# Patient Record
Sex: Female | Born: 1970 | Race: White | Hispanic: No | Smoking: Current every day smoker
Health system: Southern US, Community
[De-identification: ages and names within clinical notes are randomized; demographics above are authoritative.]

## PROBLEM LIST (undated history)

## (undated) DIAGNOSIS — L03112 Cellulitis of left axilla: Secondary | ICD-10-CM

## (undated) DIAGNOSIS — F32A Depression, unspecified: Secondary | ICD-10-CM

## (undated) DIAGNOSIS — M1990 Unspecified osteoarthritis, unspecified site: Secondary | ICD-10-CM

## (undated) DIAGNOSIS — C539 Malignant neoplasm of cervix uteri, unspecified: Secondary | ICD-10-CM

## (undated) DIAGNOSIS — Z87442 Personal history of urinary calculi: Secondary | ICD-10-CM

## (undated) DIAGNOSIS — F329 Major depressive disorder, single episode, unspecified: Secondary | ICD-10-CM

## (undated) DIAGNOSIS — J45909 Unspecified asthma, uncomplicated: Secondary | ICD-10-CM

## (undated) DIAGNOSIS — H6690 Otitis media, unspecified, unspecified ear: Secondary | ICD-10-CM

## (undated) DIAGNOSIS — I10 Essential (primary) hypertension: Secondary | ICD-10-CM

## (undated) DIAGNOSIS — N200 Calculus of kidney: Secondary | ICD-10-CM

## (undated) DIAGNOSIS — S322XXA Fracture of coccyx, initial encounter for closed fracture: Secondary | ICD-10-CM

## (undated) DIAGNOSIS — F419 Anxiety disorder, unspecified: Secondary | ICD-10-CM

## (undated) DIAGNOSIS — K219 Gastro-esophageal reflux disease without esophagitis: Secondary | ICD-10-CM

## (undated) DIAGNOSIS — E781 Pure hyperglyceridemia: Secondary | ICD-10-CM

## (undated) HISTORY — PX: DILATION AND EVACUATION: SHX1459

## (undated) HISTORY — PX: TONSILLECTOMY: SUR1361

## (undated) HISTORY — PX: CERVICAL CONE BIOPSY: SUR198

---

## 1989-06-10 HISTORY — PX: DILATION AND CURETTAGE OF UTERUS: SHX78

## 1997-11-14 ENCOUNTER — Other Ambulatory Visit: Admission: RE | Admit: 1997-11-14 | Discharge: 1997-11-14 | Payer: Self-pay | Admitting: Obstetrics

## 1997-12-21 ENCOUNTER — Other Ambulatory Visit: Admission: RE | Admit: 1997-12-21 | Discharge: 1997-12-21 | Payer: Self-pay | Admitting: Obstetrics

## 1998-01-19 ENCOUNTER — Ambulatory Visit (HOSPITAL_COMMUNITY): Admission: RE | Admit: 1998-01-19 | Discharge: 1998-01-19 | Payer: Self-pay | Admitting: Obstetrics

## 1998-07-25 ENCOUNTER — Other Ambulatory Visit: Admission: RE | Admit: 1998-07-25 | Discharge: 1998-07-25 | Payer: Self-pay | Admitting: Obstetrics

## 1999-07-25 ENCOUNTER — Other Ambulatory Visit: Admission: RE | Admit: 1999-07-25 | Discharge: 1999-07-25 | Payer: Self-pay | Admitting: Obstetrics

## 2000-08-20 ENCOUNTER — Other Ambulatory Visit: Admission: RE | Admit: 2000-08-20 | Discharge: 2000-08-20 | Payer: Self-pay | Admitting: Obstetrics

## 2003-07-31 ENCOUNTER — Emergency Department (HOSPITAL_COMMUNITY): Admission: EM | Admit: 2003-07-31 | Discharge: 2003-07-31 | Payer: Self-pay | Admitting: Emergency Medicine

## 2004-09-27 ENCOUNTER — Observation Stay (HOSPITAL_COMMUNITY): Admission: AD | Admit: 2004-09-27 | Discharge: 2004-09-28 | Payer: Self-pay | Admitting: Obstetrics

## 2004-09-28 ENCOUNTER — Encounter (INDEPENDENT_AMBULATORY_CARE_PROVIDER_SITE_OTHER): Payer: Self-pay | Admitting: Specialist

## 2005-01-04 ENCOUNTER — Emergency Department (HOSPITAL_COMMUNITY): Admission: EM | Admit: 2005-01-04 | Discharge: 2005-01-04 | Payer: Self-pay | Admitting: Emergency Medicine

## 2006-05-18 ENCOUNTER — Emergency Department (HOSPITAL_COMMUNITY): Admission: AD | Admit: 2006-05-18 | Discharge: 2006-05-18 | Payer: Self-pay | Admitting: Family Medicine

## 2007-04-06 ENCOUNTER — Ambulatory Visit (HOSPITAL_COMMUNITY): Admission: RE | Admit: 2007-04-06 | Discharge: 2007-04-06 | Payer: Self-pay | Admitting: Sports Medicine

## 2008-02-09 ENCOUNTER — Emergency Department (HOSPITAL_COMMUNITY): Admission: EM | Admit: 2008-02-09 | Discharge: 2008-02-09 | Payer: Self-pay | Admitting: Emergency Medicine

## 2008-04-19 ENCOUNTER — Emergency Department (HOSPITAL_COMMUNITY): Admission: EM | Admit: 2008-04-19 | Discharge: 2008-04-19 | Payer: Self-pay | Admitting: Emergency Medicine

## 2010-10-26 NOTE — Op Note (Signed)
[name], [name]               ACCOUNT NO.:  [id number]   MEDICAL RECORD NO.:  [id number]          PATIENT TYPE:  OBV   LOCATION:  9308                          FACILITY:  [facility]   PHYSICIAN:  Kathreen Cosier, M.D.DATE OF BIRTH:  July 07, 1970   DATE OF PROCEDURE:  09/28/2004  DATE OF DISCHARGE:                                 OPERATIVE REPORT   PREOPERATIVE DIAGNOSIS:  Intrauterine fetal demise at eight weeks and  incomplete abortion.   Using MAC, patient in lithotomy position, the perineum and vagina were  prepped and draped, bladder emptied with a straight catheter.  Bimanual exam  revealed the uterus to be retroverted.  A weighted speculum placed in the  vagina.  It was noted that products of conception were in the cervix.  The  cervix was injected with 1% Xylocaine, a total of 9 mL, at 9, 3 and 12  o'clock.  Using the sponge forceps, the products of conception were removed  from the cervix and the uterine cavity with a #9 suction used to aspirate  the remnants of the products of conception until the cavity was clean.  The  patient tolerated the procedure well, taken to the recovery room in good  condition.      BAM/MEDQ  D:  09/28/2004  T:  09/28/2004  Job:  [id number]

## 2011-01-09 ENCOUNTER — Other Ambulatory Visit: Payer: Self-pay | Admitting: Obstetrics

## 2011-01-30 ENCOUNTER — Other Ambulatory Visit: Payer: Self-pay | Admitting: Family Medicine

## 2011-01-30 ENCOUNTER — Ambulatory Visit
Admission: RE | Admit: 2011-01-30 | Discharge: 2011-01-30 | Disposition: A | Discharge status: Home / Self Care | Payer: No Typology Code available for payment source | Source: Ambulatory Visit | Attending: Family Medicine | Admitting: Family Medicine

## 2011-01-30 DIAGNOSIS — R599 Enlarged lymph nodes, unspecified: Secondary | ICD-10-CM

## 2011-01-30 MED ORDER — IOHEXOL 300 MG/ML  SOLN
75.0000 mL | Freq: Once | INTRAMUSCULAR | Status: AC | PRN
  Administered 2011-01-30: 75 mL via INTRAVENOUS

## 2011-02-03 ENCOUNTER — Ambulatory Visit (INDEPENDENT_AMBULATORY_CARE_PROVIDER_SITE_OTHER): Payer: Self-pay

## 2011-02-03 ENCOUNTER — Inpatient Hospital Stay (INDEPENDENT_AMBULATORY_CARE_PROVIDER_SITE_OTHER)
Admission: RE | Admit: 2011-02-03 | Discharge: 2011-02-03 | Disposition: A | Discharge status: Home / Self Care | Payer: Self-pay | Source: Ambulatory Visit | Attending: Family Medicine | Admitting: Family Medicine

## 2011-02-03 DIAGNOSIS — R599 Enlarged lymph nodes, unspecified: Secondary | ICD-10-CM

## 2011-03-12 LAB — POCT PREGNANCY, URINE: Preg Test, Ur: NEGATIVE

## 2013-09-19 ENCOUNTER — Encounter (HOSPITAL_COMMUNITY): Payer: Self-pay | Admitting: Emergency Medicine

## 2013-09-19 ENCOUNTER — Emergency Department (HOSPITAL_COMMUNITY)
Admission: EM | Admit: 2013-09-19 | Discharge: 2013-09-19 | Disposition: A | Discharge status: Home / Self Care | Payer: Self-pay | Attending: Emergency Medicine | Admitting: Emergency Medicine

## 2013-09-19 DIAGNOSIS — Z8781 Personal history of (healed) traumatic fracture: Secondary | ICD-10-CM | POA: Insufficient documentation

## 2013-09-19 DIAGNOSIS — T7840XA Allergy, unspecified, initial encounter: Secondary | ICD-10-CM

## 2013-09-19 DIAGNOSIS — R0602 Shortness of breath: Secondary | ICD-10-CM | POA: Insufficient documentation

## 2013-09-19 DIAGNOSIS — L509 Urticaria, unspecified: Secondary | ICD-10-CM

## 2013-09-19 DIAGNOSIS — I10 Essential (primary) hypertension: Secondary | ICD-10-CM | POA: Insufficient documentation

## 2013-09-19 DIAGNOSIS — L500 Allergic urticaria: Secondary | ICD-10-CM | POA: Insufficient documentation

## 2013-09-19 DIAGNOSIS — Z8541 Personal history of malignant neoplasm of cervix uteri: Secondary | ICD-10-CM | POA: Insufficient documentation

## 2013-09-19 HISTORY — DX: Malignant neoplasm of cervix uteri, unspecified: C53.9

## 2013-09-19 MED ORDER — DIPHENHYDRAMINE HCL 50 MG/ML IJ SOLN
50.0000 mg | Freq: Once | INTRAMUSCULAR | Status: AC
  Administered 2013-09-19: 50 mg via INTRAVENOUS
  Filled 2013-09-19: qty 1

## 2013-09-19 MED ORDER — SODIUM CHLORIDE 0.9 % IV BOLUS (SEPSIS)
1000.0000 mL | Freq: Once | INTRAVENOUS | Status: AC
  Administered 2013-09-19: 1000 mL via INTRAVENOUS

## 2013-09-19 MED ORDER — PREDNISONE 10 MG PO TABS: ORAL_TABLET | ORAL | Status: DC

## 2013-09-19 MED ORDER — METHYLPREDNISOLONE SODIUM SUCC 125 MG IJ SOLR
125.0000 mg | Freq: Once | INTRAMUSCULAR | Status: AC
  Administered 2013-09-19: 125 mg via INTRAVENOUS
  Filled 2013-09-19: qty 2

## 2013-09-19 MED ORDER — FAMOTIDINE IN NACL 20-0.9 MG/50ML-% IV SOLN
20.0000 mg | Freq: Once | INTRAVENOUS | Status: AC
  Administered 2013-09-19: 20 mg via INTRAVENOUS
  Filled 2013-09-19: qty 50

## 2013-09-19 NOTE — ED Provider Notes (Signed)
CSN: [id number]     Arrival date & time 09/19/13  1903 History   First MD Initiated Contact with Patient 09/19/13 1912     Chief Complaint  Patient presents with  . Allergic Reaction     (Consider location/radiation/quality/duration/timing/severity/associated sxs/prior Treatment) The history is provided by the patient.  Whitney Murphy is a 43 y.o. female here with rash, itchiness, tingling on lower lips. Patient has intermittent rash that is worse at night over the last month. No new food or shampoos or cleaning supplies. Today about an hour prior to arrival, she noticed her lower lips are tingling and she had some subjective shortness of breath. Denies throat closing. Denies recent camping trips. She is not on any blood pressure medicines.    Past Medical History  Diagnosis Date  . Cervical cancer   . Ankle fracture    History reviewed. No pertinent past surgical history. No family history on file. History  Substance Use Topics  . Smoking status: Not on file  . Smokeless tobacco: Not on file  . Alcohol Use: Not on file   OB History   Grav Para Term Preterm Abortions TAB SAB Ect Mult Living                 Review of Systems  Skin: Positive for rash.  All other systems reviewed and are negative.     Allergies  Review of patient's allergies indicates no known allergies.  Home Medications  No current outpatient prescriptions on file. BP 150/90  Pulse 84  Temp(Src) 97.9 F (36.6 C) (Oral)  Resp 18  Ht 5\' 4"  (1.626 m)  Wt 130 lb (58.968 kg)  BMI 22.30 kg/m2  SpO2 97% Physical Exam  Nursing note and vitals reviewed. Constitutional: She is oriented to person, place, and time. She appears well-developed and well-nourished.  HENT:  Head: Normocephalic.  OP clear. Lips not swollen. Slight redness and urticaria on the chin.   Eyes: Conjunctivae are normal. Pupils are equal, round, and reactive to light.  Neck: Normal range of motion. Neck supple.  No stridor    Cardiovascular: Normal rate, regular rhythm and normal heart sounds.   Pulmonary/Chest: Effort normal and breath sounds normal. No respiratory distress. She has no wheezes. She has no rales.  Abdominal: Soft. Bowel sounds are normal. She exhibits no distension. There is no tenderness. There is no rebound.  Musculoskeletal: Normal range of motion. She exhibits no edema and no tenderness.  Neurological: She is alert and oriented to person, place, and time. No cranial nerve deficit. Coordination normal.  Skin: Skin is warm and dry.  Maculopapular rash involving neck, trunk, back. No superimposed cellulitis   Psychiatric: She has a normal mood and affect. Her behavior is normal. Judgment and thought content normal.    ED Course  Procedures (including critical care time) Labs Review Labs Reviewed - No data to display Imaging Review No results found.   EKG Interpretation None      MDM   Final diagnoses:  None   Whitney Murphy is a 43 y.o. female here with possible allergic reaction. Has been going on for a month, got worse today. Has diffuse urticaria, no stridor. Given steroids, benadryl, pepcid. Observed for 2 hr in the ED. I am uncertain what she is allergic to. Will d/c with steroid, benadryl. Recommend allergy f/u. Return precautions given.     Wandra Arthurs, MD 09/19/13 [phone]

## 2013-09-19 NOTE — ED Notes (Signed)
Pt reports she has been having itching at night x1 month. Today reports she was at work and 1 hour ago starting itching, reddness all over, tongue slightly swollen, speech affected. Reports slight SOB. Denies pain. Reports she has never had this kind of reaction before.

## 2013-09-19 NOTE — ED Notes (Signed)
Patient is alert and oriented x3.  She was given DC instructions and follow up visit instructions.  Patient gave verbal understanding. She was DC ambulatory under her own power to home.   sHe was not showing any signs of distress on DC

## 2013-09-19 NOTE — Discharge Instructions (Signed)
Take claritin 10 mg daily.   Take prednisone as prescribed.   Take benadryl 50 mg every 8 hrs for 2 days then as needed.   You need to see a doctor and be referred to allergist.   Return to ER if you have worse rash, trouble breathing.

## 2014-03-07 ENCOUNTER — Inpatient Hospital Stay (HOSPITAL_COMMUNITY): Payer: Self-pay

## 2014-03-07 ENCOUNTER — Inpatient Hospital Stay (HOSPITAL_COMMUNITY)
Admission: AD | Admit: 2014-03-07 | Discharge: 2014-03-13 | DRG: 603 | Disposition: A | Discharge status: Home Health | Payer: Self-pay | Source: Ambulatory Visit | Attending: Family Medicine | Admitting: Family Medicine

## 2014-03-07 ENCOUNTER — Ambulatory Visit (INDEPENDENT_AMBULATORY_CARE_PROVIDER_SITE_OTHER): Payer: Self-pay | Admitting: Emergency Medicine

## 2014-03-07 ENCOUNTER — Encounter (HOSPITAL_COMMUNITY): Payer: Self-pay | Admitting: *Deleted

## 2014-03-07 VITALS — BP 116/70 | HR 97 | Temp 99.5°F | Resp 14 | Ht 66.0 in | Wt 132.0 lb

## 2014-03-07 DIAGNOSIS — L03112 Cellulitis of left axilla: Secondary | ICD-10-CM

## 2014-03-07 DIAGNOSIS — F1010 Alcohol abuse, uncomplicated: Secondary | ICD-10-CM | POA: Diagnosis present

## 2014-03-07 DIAGNOSIS — F17210 Nicotine dependence, cigarettes, uncomplicated: Secondary | ICD-10-CM | POA: Diagnosis present

## 2014-03-07 DIAGNOSIS — K5900 Constipation, unspecified: Secondary | ICD-10-CM | POA: Diagnosis not present

## 2014-03-07 DIAGNOSIS — L0291 Cutaneous abscess, unspecified: Secondary | ICD-10-CM

## 2014-03-07 DIAGNOSIS — Z720 Tobacco use: Secondary | ICD-10-CM

## 2014-03-07 DIAGNOSIS — L299 Pruritus, unspecified: Secondary | ICD-10-CM | POA: Diagnosis not present

## 2014-03-07 DIAGNOSIS — F419 Anxiety disorder, unspecified: Secondary | ICD-10-CM | POA: Diagnosis not present

## 2014-03-07 DIAGNOSIS — L02412 Cutaneous abscess of left axilla: Principal | ICD-10-CM | POA: Diagnosis present

## 2014-03-07 DIAGNOSIS — M79609 Pain in unspecified limb: Secondary | ICD-10-CM

## 2014-03-07 DIAGNOSIS — F17200 Nicotine dependence, unspecified, uncomplicated: Secondary | ICD-10-CM

## 2014-03-07 DIAGNOSIS — R110 Nausea: Secondary | ICD-10-CM | POA: Diagnosis not present

## 2014-03-07 DIAGNOSIS — M47892 Other spondylosis, cervical region: Secondary | ICD-10-CM | POA: Diagnosis present

## 2014-03-07 DIAGNOSIS — M79622 Pain in left upper arm: Secondary | ICD-10-CM

## 2014-03-07 DIAGNOSIS — IMO0002 Reserved for concepts with insufficient information to code with codable children: Secondary | ICD-10-CM

## 2014-03-07 DIAGNOSIS — B9562 Methicillin resistant Staphylococcus aureus infection as the cause of diseases classified elsewhere: Secondary | ICD-10-CM | POA: Diagnosis present

## 2014-03-07 DIAGNOSIS — L0390 Cellulitis, unspecified: Secondary | ICD-10-CM | POA: Diagnosis present

## 2014-03-07 HISTORY — DX: Major depressive disorder, single episode, unspecified: F32.9

## 2014-03-07 HISTORY — DX: Calculus of kidney: N20.0

## 2014-03-07 HISTORY — DX: Cellulitis of left axilla: L03.112

## 2014-03-07 HISTORY — DX: Essential (primary) hypertension: I10

## 2014-03-07 HISTORY — DX: Anxiety disorder, unspecified: F41.9

## 2014-03-07 HISTORY — DX: Fracture of coccyx, initial encounter for closed fracture: S32.2XXA

## 2014-03-07 HISTORY — DX: Unspecified asthma, uncomplicated: J45.909

## 2014-03-07 HISTORY — DX: Otitis media, unspecified, unspecified ear: H66.90

## 2014-03-07 HISTORY — DX: Unspecified osteoarthritis, unspecified site: M19.90

## 2014-03-07 HISTORY — DX: Pure hyperglyceridemia: E78.1

## 2014-03-07 HISTORY — DX: Depression, unspecified: F32.A

## 2014-03-07 LAB — BASIC METABOLIC PANEL
ANION GAP: 15 (ref 5–15)
BUN: 11 mg/dL (ref 6–23)
CALCIUM: 8.2 mg/dL — AB (ref 8.4–10.5)
CO2: 22 mEq/L (ref 19–32)
CREATININE: 0.55 mg/dL (ref 0.50–1.10)
Chloride: 97 mEq/L (ref 96–112)
GFR calc Af Amer: >90 mL/min (ref >90)
GFR calc non Af Amer: >90 mL/min (ref >90)
Glucose, Bld: 140 mg/dL — ABNORMAL HIGH (ref 70–99)
Potassium: 4.2 mEq/L (ref 3.7–5.3)
Sodium: 134 mEq/L — ABNORMAL LOW (ref 137–147)

## 2014-03-07 LAB — POCT CBC
GRANULOCYTE PERCENT: 82.3 % — AB (ref 37–80)
HEMATOCRIT: 39.5 % (ref 37.7–47.9)
HEMOGLOBIN: 13.4 g/dL (ref 12.2–16.2)
Lymph, poc: 1.9 (ref 0.6–3.4)
MCH, POC: 35.6 pg — AB (ref 27–31.2)
MCHC: 34 g/dL (ref 31.8–35.4)
MCV: 104.7 fL — AB (ref 80–97)
MID (cbc): 1 — AB (ref 0–0.9)
MPV: 6.7 fL (ref 0–99.8)
POC GRANULOCYTE: 13.3 — AB (ref 2–6.9)
POC LYMPH %: 11.7 % (ref 10–50)
POC MID %: 6 % (ref 0–12)
Platelet Count, POC: 304 10*3/uL (ref 142–424)
RBC: 3.77 M/uL — AB (ref 4.04–5.48)
RDW, POC: 13.4 %
WBC: 16.1 10*3/uL — AB (ref 4.6–10.2)

## 2014-03-07 LAB — PREGNANCY, URINE: Preg Test, Ur: NEGATIVE

## 2014-03-07 MED ORDER — NICOTINE 7 MG/24HR TD PT24
7.0000 mg | MEDICATED_PATCH | TRANSDERMAL | Status: DC
  Administered 2014-03-07 – 2014-03-12 (×6): 7 mg via TRANSDERMAL
  Filled 2014-03-07 (×7): qty 1

## 2014-03-07 MED ORDER — VANCOMYCIN HCL IN DEXTROSE 1-5 GM/200ML-% IV SOLN
1000.0000 mg | Freq: Once | INTRAVENOUS | Status: AC
  Administered 2014-03-07: 1000 mg via INTRAVENOUS
  Filled 2014-03-07: qty 200

## 2014-03-07 MED ORDER — LORAZEPAM 2 MG/ML IJ SOLN
1.0000 mg | Freq: Four times a day (QID) | INTRAMUSCULAR | Status: DC | PRN
  Administered 2014-03-08: 1 mg via INTRAVENOUS
  Filled 2014-03-07: qty 1

## 2014-03-07 MED ORDER — THIAMINE HCL 100 MG/ML IJ SOLN
100.0000 mg | Freq: Every day | INTRAMUSCULAR | Status: DC
  Filled 2014-03-07 (×2): qty 1

## 2014-03-07 MED ORDER — ACETAMINOPHEN 325 MG PO TABS: 650.0000 mg | ORAL_TABLET | Freq: Four times a day (QID) | ORAL | Status: DC | PRN

## 2014-03-07 MED ORDER — LORAZEPAM 1 MG PO TABS
1.0000 mg | ORAL_TABLET | Freq: Four times a day (QID) | ORAL | Status: DC | PRN
  Administered 2014-03-07 – 2014-03-09 (×6): 1 mg via ORAL
  Filled 2014-03-07 (×6): qty 1

## 2014-03-07 MED ORDER — PIPERACILLIN-TAZOBACTAM 3.375 G IVPB
3.3750 g | Freq: Three times a day (TID) | INTRAVENOUS | Status: DC
  Filled 2014-03-07 (×2): qty 50

## 2014-03-07 MED ORDER — VITAMIN B-1 100 MG PO TABS
100.0000 mg | ORAL_TABLET | Freq: Every day | ORAL | Status: DC
  Administered 2014-03-07 – 2014-03-13 (×7): 100 mg via ORAL
  Filled 2014-03-07 (×8): qty 1

## 2014-03-07 MED ORDER — ENOXAPARIN SODIUM 40 MG/0.4ML ~~LOC~~ SOLN
40.0000 mg | SUBCUTANEOUS | Status: DC
  Administered 2014-03-07 – 2014-03-10 (×4): 40 mg via SUBCUTANEOUS
  Filled 2014-03-07 (×4): qty 0.4

## 2014-03-07 MED ORDER — HYDROXYZINE HCL 10 MG/5ML PO SYRP
10.0000 mg | ORAL_SOLUTION | Freq: Three times a day (TID) | ORAL | Status: DC | PRN
  Administered 2014-03-07: 10 mg via ORAL
  Filled 2014-03-07: qty 5

## 2014-03-07 MED ORDER — SENNOSIDES-DOCUSATE SODIUM 8.6-50 MG PO TABS
1.0000 | ORAL_TABLET | Freq: Every evening | ORAL | Status: DC | PRN
  Administered 2014-03-11: 1 via ORAL
  Filled 2014-03-07: qty 1

## 2014-03-07 MED ORDER — FOLIC ACID 1 MG PO TABS
1.0000 mg | ORAL_TABLET | Freq: Every day | ORAL | Status: DC
  Administered 2014-03-07 – 2014-03-13 (×7): 1 mg via ORAL
  Filled 2014-03-07 (×8): qty 1

## 2014-03-07 MED ORDER — ADULT MULTIVITAMIN W/MINERALS CH
1.0000 | ORAL_TABLET | Freq: Every day | ORAL | Status: DC
  Administered 2014-03-07 – 2014-03-13 (×7): 1 via ORAL
  Filled 2014-03-07 (×8): qty 1

## 2014-03-07 MED ORDER — TRAMADOL HCL 50 MG PO TABS
50.0000 mg | ORAL_TABLET | Freq: Four times a day (QID) | ORAL | Status: DC | PRN
  Administered 2014-03-07 – 2014-03-09 (×7): 50 mg via ORAL
  Filled 2014-03-07 (×8): qty 1

## 2014-03-07 MED ORDER — VANCOMYCIN HCL IN DEXTROSE 1-5 GM/200ML-% IV SOLN
1000.0000 mg | Freq: Two times a day (BID) | INTRAVENOUS | Status: DC
  Administered 2014-03-08 – 2014-03-11 (×7): 1000 mg via INTRAVENOUS
  Filled 2014-03-07 (×7): qty 200

## 2014-03-07 MED ORDER — ACETAMINOPHEN 650 MG RE SUPP: 650.0000 mg | Freq: Four times a day (QID) | RECTAL | Status: DC | PRN

## 2014-03-07 MED ORDER — SODIUM CHLORIDE 0.9 % IV SOLN
INTRAVENOUS | Status: DC
  Administered 2014-03-07 – 2014-03-09 (×4): via INTRAVENOUS

## 2014-03-07 MED ORDER — PIPERACILLIN-TAZOBACTAM 3.375 G IVPB 30 MIN
3.3750 g | Freq: Once | INTRAVENOUS | Status: DC
  Filled 2014-03-07: qty 50

## 2014-03-07 NOTE — Progress Notes (Addendum)
ANTIBIOTIC CONSULT NOTE - INITIAL  Pharmacy Consult for vancomycin Indication: cellulitis  No Known Allergies  Patient Measurements: Height: 5\' 6"  (167.6 cm) Weight: 131 lb 15.8 oz (59.87 kg) IBW/kg (Calculated) : 59.3   Vital Signs: Temp: 98.9 F (37.2 C) (09/28 1503) Temp src: Oral (09/28 1503) BP: 124/62 mmHg (09/28 1503) Pulse Rate: 91 (09/28 1503) Intake/Output from previous day:   Intake/Output from this shift:    Labs:  Recent Labs  03/07/14 1305  WBC 16.1*  HGB 13.4   CrCl is unknown because no creatinine reading has been taken. No results found for this basename: VANCOTROUGH, VANCOPEAK, VANCORANDOM, GENTTROUGH, GENTPEAK, GENTRANDOM, TOBRATROUGH, TOBRAPEAK, TOBRARND, AMIKACINPEAK, AMIKACINTROU, AMIKACIN,  in the last 72 hours   Microbiology: No results found for this or any previous visit (from the past 720 hour(s)).  Medical History: Past Medical History  Diagnosis Date  . Cervical cancer     Dr. Anastasia Pall     Assessment: 41 YOF initially presented to Urgent Care with an abscess under her left axilla x4 days. Noted in Urgent Care physical examination to have 6in x3.5in area of redness with pustules present. She is also noted to have chills (tmax 99.5) along with an elevated WBC of 16.1. There is no SCr on file. No mention of renal dysfunction in her history.  Goal of Therapy:  Vancomycin trough level 10-15 mcg/ml  Plan:  1. Vancomycin 1000mg  IV x1 as a loading dose 2. Stat BMET ordered to establish renal function. A maintenance dose of vancomycin will be ordered after this results. 3. Follow up clinical progression, c/s, renal function, trough at [place] D. Hortense Cantrall, PharmD, BCPS Clinical Pharmacist Pager: [phone] 03/07/2014 4:40 PM  ADDENDUM BMET resulted with SCr of 0.55, est CrCl 40mL/min  Plan: 1. Vancomycin 1000mg  IV q12h as maintenance dosing starting tomorrow at 0600  Vale Mousseau D. Brandilyn Nanninga, PharmD, BCPS Clinical Pharmacist Pager:  [phone] 03/07/2014 8:11 PM

## 2014-03-07 NOTE — H&P (Signed)
Seen and examined.  Agree with the admit, documentation and management by Dr. Raeford Razor.  Briefly,  43 yo female with left axillary cellulitis, possible abscess.  She is not immunocompromised nor is she a diabetic.  I assume the normal organisms of staph and strep.  Will treat with vanc.  Axillary ultrasound to see if she needs an I&D.

## 2014-03-07 NOTE — H&P (Signed)
[facility] Admission History and Physical   Patient name: Whitney Murphy Medical record number: [id number] Date of birth: 07-08-1970 Age: 43 y.o. Gender: female  Primary Care Provider: No PCP Per Patient Consultants: None Code Status: Full, confirmed on admission  Chief Complaint: Cellulitis  Assessment and Plan: [name] is a 43 y.o. female presenting from Urgent Care with left axilla cellulitis and possible abscess. Patient with no significant PMH.   #Left axilla cellulitis: 2/4 SIRS criteria present on admission (HR >90 and WBC >12). 6 x 4 cm mobile, fluctuant, warm, tender mass in axilla concerning for abscess. As cellulitis is localized, no signs of systemic toxicity, and Pt has no history of diabetes, will cover gram positives. - IV vancomycin 1 g q24 hrs (9/28 -   - BCx x2 - L axilla ultrasound to evaluate for abscess to drain. If present will I&D - For pain: Tylenol 650 mg PO q6 hrs PRN, tramadol 50 mg PO q6 hrs PRN - HIV, RPR, and hepatitis B/C serologies given tattoo history  #Nicotine abuse: Pt endorses smoking about 1 ppd and would like a nicotine patch. Roughly 24 pack year history.  - Nicoderm patch 7 mg q24 hrs  #Alcohol abuse: Pt endorses drinking about 20 beers a week. - CIWA protocol  FEN/GI: NS at 100 cc/hr; Heart healthy diet Prophylaxis: Lovenox 40 mg SQ q24 hrs  Disposition: admitted to [facility] Medicine Teaching Service for cellulitis  History of Present Illness: Whitney Murphy is a 43 y.o. female presenting with left axilla cellulitis with likely abscess. Patient developed redness and tenderness in her left axilla three nights ago, which progressed the next day with multiple pimples forming. Her husband sterilized a needle and popped a few of them, but the pain continued to get worse with "lumps forming" underneath her armpit. She presented to Urgent Care who sent her to the hospital as a direct admit.  The patient  believes the source of her infection was from lying on a tattoo parlor table with her arms dangling off the sides. She has around 6 tattoos and was getting her newest colored in. Animal contacts include 3 dogs at home, no Hx of recent scratches. No travel history or sick contacts. Denies Hx of MRSA. Nausea has accompanied the pain, but no vomiting. She has felt a subjective fever but temperature was not above 100 F at Urgent Care. No drenching sweats, dysuria, diarrhea   Review Of Systems: Per HPI with the following additions: no SOB, chest pain, abdominal pain, extremity swelling. Otherwise 12 point review of systems was performed and was unremarkable.  Patient Active Problem List   Diagnosis Date Noted  . Cellulitis 03/07/2014   Past Medical History: Past Medical History  Diagnosis Date  . Cervical cancer     Dr. Anastasia Pall   C5-C6 osteoarthritis  Past Surgical History: Past Surgical History  Procedure Laterality Date  . Dilation and curettage of uterus      just in lining, normal pap since    Social History: History  Substance Use Topics  . Smoking status: Current Every Day Smoker -- 1.00 packs/day for 24 years    Types: Cigarettes  . Smokeless tobacco: Not on file  . Alcohol Use: Yes     Comment: 2 beers after work, up to 20 in week   Lives with husband. Works at [employer]. 3 dogs at home.   Family History: Family History  Problem Relation Age of Onset  .  Depression Mother   . Heart disease Father     stent put in   Allergies and Medications: No Known Allergies No current facility-administered medications on file prior to encounter.   No current outpatient prescriptions on file prior to encounter.  Alleve and voltaren gel for cervical OA Benadryl for seasonal allergies  Objective: BP 124/62  Pulse 91  Temp(Src) 98.9 F (37.2 C) (Oral)  Resp 14  Ht 5\' 6"  (1.676 m)  Wt 131 lb 15.8 oz (59.87 kg)  BMI 21.31 kg/m2  SpO2 98% Exam: General:  uncomfortable-appearing woman lying in bed in no acute distress HEENT: PERRL, yellowed teeth, MMM, no oropharyngeal erythema or exudates Lymph: no cervical or axillary lymphadenopathy Cardiovascular: RRR, no murmurs/rubs/gallops Respiratory: CTAB, normal work of breathing Abdomen: soft, non-tender, non-distended, bowel sounds present Extremities: 2+ radial and dorsalis pedis pulses bilaterally, no edema Skin: warmth, tenderness, and erythema of the left axilla extending with three pustules.  6 x 4 cm mobile, fluctuant, warm, tender mass in left axilla. Erythema outlined  Neuro: moves upper and lower extremities appropriately. Normal sensation in bilateral upper and lower extremities.  Labs and Imaging: CBC BMET   Recent Labs Lab 03/07/14 1305  WBC 16.1*  HGB 13.4  HCT 39.5   No results found for this basename: NA, K, CL, CO2, BUN, CREATININE, GLUCOSE, CALCIUM,  in the last 168 hours     Cathleen Fears, Med Student 03/07/2014, 5:12 PM Pager: [phone], text pages welcome  Upper Level Addendum:  I have seen and evaluated this patient along with MS Mr. Floreen Comber and reviewed the above note, making necessary revisions in [facility].   Clearance Coots, MD Family Medicine PGY-2

## 2014-03-07 NOTE — Progress Notes (Addendum)
   Subjective:    Patient ID: Whitney Murphy, female    DOB: [birth year], 43 y.o.   MRN: [id number] This chart was scribed for Arlyss Queen, MD by Zola Button, ED Scribe. This patient was seen in room 2 and the patient's care was started at 12:45 PM.   HPI HPI Comments: Whitney Murphy is a 43 y.o. female who [facility] complaining of an abscess under the left armpit that began 4 days ago. Patient noticed zits there 3 days ago and notes increased pain starting yesterday. Patient's husband helped her pop some of her zits with a sterilized needle. The pain radiates the her face and side, and is described as fluctuating from a 6/10 to 8/10 in severity. She notes associated subjective fever, nausea when pain is most severe and shaking chills that began last night. She denies prior hx of staph infection, MRSA, and any other medical problems, except she notes a hx of osteoarthritis. She is not currently taking any regular medications. Patient notes that she recently got tattoo work done on 02/11/14 and 02/24/14. She has not been to the hospital recently and has NKDA.   Review of Systems See HPI. A complete 10 system review of systems was obtained and all systems are negative except as noted in the HPI and PMH.      Objective:   Physical Exam  CONSTITUTIONAL: Well developed/well nourished, patient is flushed having active chills in the room, left axilla has a 6 in by 3.5 in area of redness with 3 pustules present, these areas are red and exquisitely tender HEAD: Normocephalic/atraumatic EYES: EOM/PERRL ENMT: Mucous membranes moist NECK: supple no meningeal signs SPINE: entire spine nontender CV: S1/S2 noted, no murmurs/rubs/gallops noted LUNGS: Lungs are clear to auscultation bilaterally, no apparent distress ABDOMEN: soft, nontender, no rebound or guarding GU: no cva tenderness NEURO: Pt is awake/alert, moves all extremitiesx4 EXTREMITIES: pulses normal, full  ROM SKIN: warm, color normal PSYCH: no abnormalities of mood noted Breast exam: there is firm fibrous feeling tissue upper quadrant left breast       Assessment & Plan:  Patient will be direct admit to family medicine for cultures IV antibiotics consideration of blood cultures. She also has an abnormal area upper-outer left breast which needs evaluation . In addition to her aggressive appearing cellular I personally performed the services described in this documentation, which was scribed in my presence. The recorded information has been reviewed and is accurate.

## 2014-03-08 LAB — CBC
HCT: 33.8 % — ABNORMAL LOW (ref 36.0–46.0)
Hemoglobin: 11.6 g/dL — ABNORMAL LOW (ref 12.0–15.0)
MCH: 34.6 pg — ABNORMAL HIGH (ref 26.0–34.0)
MCHC: 34.3 g/dL (ref 30.0–36.0)
MCV: 100.9 fL — AB (ref 78.0–100.0)
Platelets: 252 10*3/uL (ref 150–400)
RBC: 3.35 MIL/uL — ABNORMAL LOW (ref 3.87–5.11)
RDW: 12.9 % (ref 11.5–15.5)
WBC: 11.2 10*3/uL — ABNORMAL HIGH (ref 4.0–10.5)

## 2014-03-08 LAB — RPR

## 2014-03-08 LAB — HEPATITIS B SURFACE ANTIGEN: Hepatitis B Surface Ag: NEGATIVE

## 2014-03-08 LAB — BASIC METABOLIC PANEL
Anion gap: 12 (ref 5–15)
BUN: 8 mg/dL (ref 6–23)
CALCIUM: 7.9 mg/dL — AB (ref 8.4–10.5)
CHLORIDE: 98 meq/L (ref 96–112)
CO2: 24 mEq/L (ref 19–32)
CREATININE: 0.65 mg/dL (ref 0.50–1.10)
GFR calc non Af Amer: >90 mL/min (ref >90)
Glucose, Bld: 96 mg/dL (ref 70–99)
Potassium: 3.8 mEq/L (ref 3.7–5.3)
Sodium: 134 mEq/L — ABNORMAL LOW (ref 137–147)

## 2014-03-08 LAB — HEPATITIS B SURFACE ANTIBODY,QUALITATIVE: HEP B S AB: NEGATIVE

## 2014-03-08 LAB — HEPATITIS C ANTIBODY: HCV Ab: NEGATIVE

## 2014-03-08 LAB — HIV ANTIBODY (ROUTINE TESTING W REFLEX): HIV 1&2 Ab, 4th Generation: NONREACTIVE

## 2014-03-08 LAB — HEPATITIS B CORE ANTIBODY, TOTAL: Hep B Core Total Ab: NONREACTIVE

## 2014-03-08 NOTE — Progress Notes (Signed)
Family Medicine Teaching Service Daily Progress Note Intern Pager: [phone]  Patient name: Whitney Murphy Medical record number: [id number] Date of birth: [birth year] Age: 43 y.o. Gender: female  Primary Care Provider: No PCP Per Patient Consultants: None Code Status: Full  Pt Overview and Major Events to Date:  Whitney Murphy is a 43 y.o. female with no significant PMH who presented from Urgent Care with left axilla cellulitis. No sign of abscess on [place].  Assessment and Plan:  #Left axilla cellulitis: 2/4 SIRS criteria present on admission (HR >90 and WBC >12). 6 x 4 cm mobile, fluctuant, warm, tender mass in axilla concerning for abscess, but US shows only. Leukocytosis resolving.  Area of erythema has grown about 4 in, but as it is still localized, no signs of systemic toxicity, and Pt has no history of diabetes, will continue to cover only gram positives.  - IV vancomycin per pharmacy (9/28 - ) - f/u BCx x2  - For pain: Tylenol 650 mg PO q6 hrs PRN, tramadol 50 mg PO q6 hrs PRN   #Health maintenance: HIV, RPR, and hepatitis B/C serologies all negative. HbSAb negative as well, indicating unvaccinated. - Hep B vaccination as outpatient  #Nicotine abuse: Pt endorses smoking about 1 ppd and would like a nicotine patch. Roughly 24 pack year history.  - Nicoderm patch 7 mg q24 hrs   #Alcohol abuse: Pt endorses drinking about 20 beers a week.  - CIWA protocol   FEN/GI: NS at 100 cc/hr; Heart healthy diet   Prophylaxis: Lovenox 40 mg SQ q24 hrs  Disposition: admitted to [facility] Medicine Teaching Service for cellulitis  Subjective: Spiked a fever to 101.3 last night, which came down w/o Tylenol. Pain about the same, but Pt says the Tramadol and Ativan help and is feeling better overall. Was able to eat dinner yesterday w/o nausea.  Objective: Temp:  [98.8 F (37.1 C)-101.3 F (38.5 C)] 98.8 F (37.1 C) (09/29 0456) Pulse Rate:  [84-103] 98 (09/29 0500) Resp:  [14-17] 17 (09/29  0456) BP: (116-131)/(62-75) 123/75 mmHg (09/29 0500) SpO2:  [94 %-98 %] 94 % (09/29 0456) Weight:  [131 lb 15.8 oz (59.87 kg)-132 lb (59.875 kg)] 131 lb 15.8 oz (59.87 kg) (09/28 1503) Physical Exam: General: sitting up in bed, NAD Cardiovascular: RRR, no murmurs heard Respiratory: CTAB, normal work of breathing Abdomen: soft, non-tender, non-distended Extremities: 2+ radial and dorsalis pedis pulses bilaterally, warmth, tenderness, and erythema of the left axilla extending with three pustules. Erythema now extending about 4 in below the outline mark made yesterday, likely developing phlegmon  Laboratory:  Recent Labs Lab 03/07/14 1305 03/08/14 0517  WBC 16.1* 11.2*  HGB 13.4 11.6*  HCT 39.5 33.8*  PLT  --  252    Recent Labs Lab 03/07/14 1850 03/08/14 0517  NA 134* 134*  K 4.2 3.8  CL 97 98  CO2 22 24  BUN 11 8  CREATININE 0.55 0.65  CALCIUM 8.2* 7.9*  GLUCOSE 140* 96   Blood Cx: Pending.  Imaging/Diagnostic Tests: [place] chest/axilla: No abscess is identified. Subcutaneous fat shows mild edema, likely representing cellulitis.  Dineen Kid, Medical Student (MS4)  I have seen and evaluated the above patient and agree with the note.  My physical exam is below:  General: well appearing female in NAD.  Cardiovascular: RRR, no m/r/g. Respiratory: CTAB.  Extremities: No LE edema.  Skin: Left axilla - significant warmth and erythema noted.  Now extending ~ 4 inches below demarcation line.   Michael Boston  [place] PGY-3

## 2014-03-08 NOTE — Discharge Summary (Signed)
[facility] Discharge Summary  Patient name: Whitney Murphy Medical record number: [id number] Date of birth: July 06, 1970 Age: 43 y.o. Gender: female Date of Admission: 03/07/2014  Date of Discharge: 03/13/14 Admitting Physician: Zigmund Gottron, MD  Primary Care Provider: No PCP Per Patient Consultants: Surgery  Indication for Hospitalization: Cellulitis  Discharge Diagnoses/Problem List:  Cellulitis  Abscess (MRSA)  Disposition: Discharged home  Discharge Condition: Stable  Discharge Exam:  General: well-appearing woman sitting in hospital bed in no apparent distress Cardiovascular: RRR, no murmurs/rubs/gallops Respiratory: CTAB, normal work of breathing  Abdomen: soft, non-tender, non-distended, bowel sounds present Extremities: left axilla with clearing erythema. Incision from I&D healing well with no signs of infection  Brief Hospital Course: Whitney Murphy is a 43 yo female with no significant PMH who was sent as a direct admit from Urgent Care with left axilla cellulitis with an underlying mass concerning for abscess. Blood cultures x2 were drawn and the patient was started on IV vancomycin. An ultrasound of her left upper extremity/chest was done on admission that showed no signs of abscess formation, only soft tissue edema. There was inadequate clinical response after 48 hours, so IV Zosyn was added and surgery was consulted for treatment of the mass which had become indurated and quite painful. Surgery incised and drained the mass, which had formed into an abscess. 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 The patient will be discharged on doxycycline to complete a 10-day course as an outpatient. Home health ordered for wound care. Social work assisted with information about PCP and insurance prior to discharge.   Issues for Follow Up:   Surgery follow-up in 1 week to confirm resolution of cellulitis  Significant Procedures: I&D of left  axilla abscess  Significant Labs and Imaging:   Recent Labs Lab 03/09/14 0915 03/10/14 1053 03/12/14 0520  WBC 9.0 10.0 7.5  HGB 11.3* 11.2* 10.8*  HCT 33.1* 33.4* 31.9*  PLT 248 303 359    Recent Labs Lab 03/07/14 1850 03/08/14 0517  NA 134* 134*  K 4.2 3.8  CL 97 98  CO2 22 24  GLUCOSE 140* 96  BUN 11 8  CREATININE 0.55 0.65  CALCIUM 8.2* 7.9*   Blood cultures x2 NGTD Abscess cultures: MRSA  Results/Tests Pending at Time of Discharge:   Discharge Medications:    Medication List         diphenhydrAMINE 25 MG tablet  Commonly known as:  BENADRYL  Take 25 mg by mouth every 6 (six) hours as needed for allergies.     doxycycline 100 MG tablet  Commonly known as:  VIBRA-TABS  Take 1 tablet (100 mg total) by mouth 2 (two) times daily.     HYDROcodone-acetaminophen 5-325 MG per tablet  Commonly known as:  NORCO/VICODIN  Take 1-2 tablets by mouth every 6 (six) hours.     hydrOXYzine 50 MG capsule  Commonly known as:  VISTARIL  Take 1 capsule (50 mg total) by mouth 3 (three) times daily as needed for anxiety.     senna-docusate 8.6-50 MG per tablet  Commonly known as:  Senokot-S  Take 1 tablet by mouth at bedtime as needed for mild constipation.       Discharge Instructions: You were admitted to [facility] for a skin infection in your left armpit. This was treated with IV antibiotics. Part of the infection had walled itself off and formed a lump called an abscess, which Surgery took you to the operating  room to drain. We switched you from IV antibiotics to oral antibiotics as your infection cleared up, and we're discharging you will the rest of the oral antibiotics you need to ensure the infection is fully treated. Please follow up with your primary care doctor in 1 week to make sure the infection is totally cleared.  Follow-Up Appointments: Follow-up Information   Follow up with Your primary care doctor. Schedule an appointment as soon as possible for  a visit in 1 week.      Follow up with [facility]. Schedule an appointment as soon as possible for a visit in 10 days.   Contact information:   [address] [place] [address] [phone]       Olam Idler, MD 03/13/2014, 9:54 AM Pager: [phone]

## 2014-03-08 NOTE — Progress Notes (Signed)
Seen and examined.  Agree with the documentation and management of Dr.  Lacinda Axon.  Cellulitis extended a bit.  Systemically, patient feels better.  No abscess per ultrasound.  Continue vanc

## 2014-03-09 LAB — CBC
HCT: 33.1 % — ABNORMAL LOW (ref 36.0–46.0)
Hemoglobin: 11.3 g/dL — ABNORMAL LOW (ref 12.0–15.0)
MCH: 35.4 pg — ABNORMAL HIGH (ref 26.0–34.0)
MCHC: 34.1 g/dL (ref 30.0–36.0)
MCV: 103.8 fL — ABNORMAL HIGH (ref 78.0–100.0)
Platelets: 248 10*3/uL (ref 150–400)
RBC: 3.19 MIL/uL — ABNORMAL LOW (ref 3.87–5.11)
RDW: 12.8 % (ref 11.5–15.5)
WBC: 9 10*3/uL (ref 4.0–10.5)

## 2014-03-09 MED ORDER — ACETAMINOPHEN 325 MG PO TABS
650.0000 mg | ORAL_TABLET | Freq: Four times a day (QID) | ORAL | Status: DC
  Administered 2014-03-09 – 2014-03-11 (×6): 650 mg via ORAL
  Filled 2014-03-09 (×9): qty 2

## 2014-03-09 MED ORDER — TRAMADOL HCL 50 MG PO TABS
50.0000 mg | ORAL_TABLET | Freq: Four times a day (QID) | ORAL | Status: DC
  Administered 2014-03-09 – 2014-03-10 (×3): 50 mg via ORAL
  Filled 2014-03-09 (×3): qty 1

## 2014-03-09 MED ORDER — ACETAMINOPHEN 650 MG RE SUPP
650.0000 mg | Freq: Four times a day (QID) | RECTAL | Status: DC
  Administered 2014-03-10: 650 mg via RECTAL
  Filled 2014-03-09 (×12): qty 1

## 2014-03-09 NOTE — Progress Notes (Signed)
Family Medicine Teaching Service Daily Progress Note Intern Pager: [phone]  Patient name: Whitney Murphy Medical record number: [id number] Date of birth: 05-03-1971 Age: 44 y.o. Gender: female  Primary Care Provider: Arlyss Queen, MD Consultants: None Code Status: Full  Pt Overview and Major Events to Date:  9/28: Admitted for cellulitis  9/30: continue vanc, will transition tomorrow.  Assessment and Plan: Whitney Murphy is a 43 y.o. female with no significant PMH who presented from Urgent Care with left axilla cellulitis. No sign of abscess on [place].  #Left axilla cellulitis: 2/4 SIRS criteria present on admission (HR >90 and WBC >12). 6 x 4 cm mobile, fluctuant, warm, tender mass in axilla concerning for abscess, but US shows only mild edema. Leukocytosis resolving.  Erythema clearing, area still tender. - IV vancomycin per pharmacy (9/28 - ) - will transition to oral doxycycline tomorrow. If continued improvement.  - BCx x2 NGTD - For pain: Tylenol 650 mg PO q6 hrs, tramadol 50 mg PO q6 hrs PRN   #Nicotine abuse: Pt endorses smoking about 1 ppd and would like a nicotine patch. Roughly 24 pack year history.  - Nicoderm patch 7 mg q24 hrs   #Alcohol abuse: Pt endorses drinking about 20 beers a week.  - CIWA protocol - removed ativan as patient was requesting it and CIWA scores were not elevated.   FEN/GI: MIVF discontinued as Pt had good PO intake; Heart healthy diet  Prophylaxis: Lovenox 40 mg SQ q24 hrs  Disposition: Dc tomorrow pending continued improvement  Subjective: NAEON. Pain is about the same, but overall Pt feels better. Appetite is improved, although Pt does not like the food here. Denies chills, sweats, abdominal pain, chest pain.  Objective: Temp:  [98.3 F (36.8 C)-99.3 F (37.4 C)] 98.3 F (36.8 C) (09/30 0502) Pulse Rate:  [87-92] 87 (09/30 0502) Resp:  [16-18] 16 (09/30 0502) BP: (116-121)/(72-80) 116/78 mmHg (09/30 0502) SpO2:  [95 %-96 %] 95 % (09/30  0502)  Physical Exam: General: sitting up in bed wearing new hospital gown, NAD Cardiovascular: RRR, no murmurs heard Respiratory: CTAB, normal work of breathing Abdomen: soft, non-tender, non-distended Extremities: 2+ radial and dorsalis pedis pulses bilaterally, warmth, tenderness, and erythema of the left axilla extending with three healing pustules. Erythema starting to clear up and recede  Laboratory:  Recent Labs Lab 03/07/14 1305 03/08/14 0517  WBC 16.1* 11.2*  HGB 13.4 11.6*  HCT 39.5 33.8*  PLT  --  252    Recent Labs Lab 03/07/14 1850 03/08/14 0517  NA 134* 134*  K 4.2 3.8  CL 97 98  CO2 22 24  BUN 11 8  CREATININE 0.55 0.65  CALCIUM 8.2* 7.9*  GLUCOSE 140* 96   Blood Cx: NGTD  Imaging/Diagnostic Tests: [place] chest/axilla: No abscess is identified. Subcutaneous fat shows mild edema, likely representing cellulitis.  Dineen Kid, Medical Student (MS4)   Upper Level Addendum:  I have seen and evaluated this patient along with MS Mr. Floreen Comber and reviewed the above note, making necessary revisions in [facility].   General: Well appearing female, NAD Cardiovascular: RRR, no murmurs heard Respiratory: CTAB, normal work of breathing Ext: moving all limbs freely  Skin: left axilla: erythema is regressing towards original markings.   Clearance Coots, MD Family Medicine PGY-2

## 2014-03-09 NOTE — Progress Notes (Signed)
Seen and examined. Agree with the management and documentation of Dr. Raeford Razor.  Axillary redness has not yet decreased.  If she has not shown some improvement by tomorrow morning, we will broaden antibiotic coverage.

## 2014-03-10 ENCOUNTER — Encounter (HOSPITAL_COMMUNITY): Payer: Self-pay | Admitting: General Surgery

## 2014-03-10 DIAGNOSIS — L0390 Cellulitis, unspecified: Secondary | ICD-10-CM

## 2014-03-10 DIAGNOSIS — Z720 Tobacco use: Secondary | ICD-10-CM

## 2014-03-10 DIAGNOSIS — L0291 Cutaneous abscess, unspecified: Secondary | ICD-10-CM

## 2014-03-10 LAB — CBC
HCT: 33.4 % — ABNORMAL LOW (ref 36.0–46.0)
HEMOGLOBIN: 11.2 g/dL — AB (ref 12.0–15.0)
MCH: 35 pg — ABNORMAL HIGH (ref 26.0–34.0)
MCHC: 33.5 g/dL (ref 30.0–36.0)
MCV: 104.4 fL — ABNORMAL HIGH (ref 78.0–100.0)
PLATELETS: 303 10*3/uL (ref 150–400)
RBC: 3.2 MIL/uL — AB (ref 3.87–5.11)
RDW: 12.8 % (ref 11.5–15.5)
WBC: 10 10*3/uL (ref 4.0–10.5)

## 2014-03-10 MED ORDER — MORPHINE SULFATE 2 MG/ML IJ SOLN
1.0000 mg | INTRAMUSCULAR | Status: DC | PRN
  Administered 2014-03-10: 4 mg via INTRAVENOUS
  Administered 2014-03-10: 2 mg via INTRAVENOUS
  Administered 2014-03-10 – 2014-03-12 (×8): 4 mg via INTRAVENOUS
  Filled 2014-03-10 (×6): qty 2
  Filled 2014-03-10: qty 1
  Filled 2014-03-10 (×3): qty 2

## 2014-03-10 MED ORDER — PIPERACILLIN-TAZOBACTAM 3.375 G IVPB
3.3750 g | Freq: Three times a day (TID) | INTRAVENOUS | Status: DC
  Administered 2014-03-10 – 2014-03-13 (×9): 3.375 g via INTRAVENOUS
  Filled 2014-03-10 (×12): qty 50

## 2014-03-10 MED ORDER — HYDROCODONE-ACETAMINOPHEN 5-325 MG PO TABS
1.0000 | ORAL_TABLET | Freq: Four times a day (QID) | ORAL | Status: DC | PRN
Start: 2014-03-10 — End: 2014-03-11
  Administered 2014-03-10 – 2014-03-11 (×3): 1 via ORAL
  Filled 2014-03-10 (×3): qty 1

## 2014-03-10 NOTE — Progress Notes (Signed)
Patient would like to have a doctor see the tiny open area at her left axilla which has minimal serosanguinous drainage.  Cleaned the tiny open area with NS and cover it with gauze.

## 2014-03-10 NOTE — Progress Notes (Signed)
Family Medicine Teaching Service Daily Progress Note Intern Pager: [phone]  Patient name: Whitney Murphy Medical record number: [id number] Date of birth: [birth year] Age: 43 y.o. Gender: female  Primary Care Provider: Arlyss Queen, MD Consultants: None Code Status: Full  Pt Overview and Major Events to Date:  9/28: Admitted for cellulitis  9/30: continue vanc, will transition tomorrow. 10/1: consult surgery; zosyn started  Assessment and Plan: Whitney Murphy is a 43 y.o. female with no significant PMH who presented from Urgent Care with left axilla cellulitis.  #Left axilla cellulitis: Leukocytosis resolved.  Erythema is clearing in intensity but at the same time extending in area. Mass in left axilla hardened and causing increased pain, concerning for newly formed abscess. Patient has been afebrile. - Surgery c/s to drain possible abscess - IV vancomycin per pharmacy (9/28 - ) - Added IV Zosyn to cover gram negs, anaerobes (10/1 - ) - BCx x2 NGTD - For pain: Tylenol 650 mg PO q6 hrs, Norco 5-325 mg PO q6 hrs PRN with senna-docusate PRN constipation  #Nicotine abuse: Pt endorses smoking about 1 ppd and would like a nicotine patch. Roughly 24 pack year history.  - Nicoderm patch 7 mg q24 hrs   #Alcohol abuse: Pt endorses drinking about 20 beers a week.  - CIWA protocol - Removed ativan as patient was requesting it and CIWA scores were not elevated.   FEN/GI: MIVF discontinued as Pt had good PO intake; Heart healthy diet  Prophylaxis: Lovenox 40 mg SQ q24 hrs  Disposition: Discharge pending improvement of cellulitis and drainage of abscess if present.  Subjective: Overnight the larger of the 3 pustules in patient's axilla burst and started draining. The mass in her left axilla has also hardened and is causing increased pain. The patient also reports more nausea and decreased appetite.   Objective: Temp:  [98.1 F (36.7 C)-99.5 F (37.5 C)] 98.7 F (37.1 C) (10/01 1035) Pulse  Rate:  [84-101] 101 (10/01 1035) Resp:  [16-20] 20 (10/01 0938) BP: (125-131)/(70-86) 130/70 mmHg (10/01 0938) SpO2:  [95 %-98 %] 95 % (10/01 1035)  Physical Exam: General: sitting up in bed, tearful about pain, but NAD Cardiovascular: RRR, no murmurs heard Respiratory: CTAB, normal work of breathing Abdomen: soft, non-tender, non-distended Extremities: 2+ radial and dorsalis pedis pulses bilaterally, warmth, tenderness, and erythema of the left axilla with three healing pustules; largest pustule has drained and now covered with gauze bandage. Erythema clearing in intensity but extending further inferiorly and posteriorly, beginning to involve the back.   Laboratory:  Recent Labs Lab 03/08/14 0517 03/09/14 0915 03/10/14 1053  WBC 11.2* 9.0 10.0  HGB 11.6* 11.3* 11.2*  HCT 33.8* 33.1* 33.4*  PLT 252 248 303    Recent Labs Lab 03/07/14 1850 03/08/14 0517  NA 134* 134*  K 4.2 3.8  CL 97 98  CO2 22 24  BUN 11 8  CREATININE 0.55 0.65  CALCIUM 8.2* 7.9*  GLUCOSE 140* 96   Blood Cx: NGTD  Dineen Kid, Medical Student (MS4) Pages welcome: [phone]  Upper Level Addendum  I have seen and examined the patient. I have read and agree with the above subjective assessment. Assessment and plan updated with blue font. My physical exam is below  General: well appearing, slightly tearful and looks in pain. No distress Respiratory/Chest: clear to auscultation bilaterally Cardiovascular: Regular rate and rhythm without murmur Abdomen: soft, non-tender, non-distended. Skin: left axilla with large area of induration and minimal fluctuance. Small lesion above that has  an ulceration and no drainage. Axillary lesions are tender and erythematous (please see attached photo) Extremities: no edema or calf tenderness   Left axilla (03/10/2014): Shoulder is flexed and is at the top of the photo.   Cordelia Poche, MD PGY-2, [facility] [place] [facility] 03/10/2014, 12:46 PM

## 2014-03-10 NOTE — Consult Note (Signed)
For I&D L axillary abscess in AM. Procedure, risks, and benefits D/W her and she agrees. Patient examined and I agree with the assessment and plan  Georganna Skeans, MD, MPH, FACS Trauma: [phone] General Surgery: [phone]  03/10/2014 6:14 PM

## 2014-03-10 NOTE — Consult Note (Signed)
[facility] Consult Note  Whitney Murphy 02/09/1971  [id number].    Requesting MD: Dr. Andria Frames Chief Complaint/Reason for Consult: Left axillary abscess  HPI:  43 y.o. white female presented to [facility] with left axilla cellulitis. Patient developed redness and pain in her left axilla on Friday Sept 25th, which progressed the next day with multiple pimples forming. Her husband sterilized a needle and popped a few of them with purulent drainage, but the pain continued to get worse with hard lumps underneath her skin. After being evaluated at [facility] they sent her to [facility] to be directly admitted.    The patient recently got a new tattoo, but on her right upper back and notes laying on the table with her arms over the sides.  Denies animal scratch, or bug bites.  No recent travel.  C/o nausea/fever/chills, but no vomiting.  No abdominal pain, CP/SOB, objective fevers, sweating, bowel or bladder problems.  She denies every having something like this before.  Never had trouble with her skin.  Can't lay on the left side due to pain.  No other precipitating/alleviating factors.  No radiating pain, but the erythema is spreading to her left breast, lateral chest wall, and towards her back.  Pain 6-8/10.  No h/o MRSA infection   ROS: All systems reviewed and otherwise negative except for as above  Family History  Problem Relation Age of Onset  . Depression Mother   . Heart disease Father     stent put in    Past Medical History  Diagnosis Date  . Cervical cancer     Dr. Anastasia Pall   . Cellulitis of axilla, left 03/07/2014  . Hypertension     hx  . High triglycerides     hx  . Asthma   . Arthritis     "neck" (03/07/2014)  . Anxiety   . Depression   . Nephrolithiasis   . Chronic ear infection   . Fractured coccyx     x2    Past Surgical History  Procedure Laterality Date  . Tonsillectomy    . Dilation and evacuation  ~ 2008  . Dilation and curettage of uterus  1991   "miscarriage"  . Cervical cone biopsy  ~ 1999    just in lining, normal pap since     Social History:  reports that she has been smoking Cigarettes.  She has a 24 pack-year smoking history. She has never used smokeless tobacco. She reports that she drinks about 12 ounces of alcohol per week. She reports that she uses illicit drugs.  Allergies: No Known Allergies  Medications Prior to Admission  Medication Sig Dispense Refill  . diphenhydrAMINE (BENADRYL) 25 MG tablet Take 25 mg by mouth every 6 (six) hours as needed for allergies.        Blood pressure 130/80, pulse 91, temperature 98.2 F (36.8 C), temperature source Oral, resp. rate 18, height 5\' 6"  (1.676 m), weight 131 lb 15.8 oz (59.87 kg), last menstrual period 02/14/2014, SpO2 99.00%. Physical Exam: General: pleasant, WD/WN white female who is laying in bed in NAD HEENT: head is normocephalic, atraumatic.  Sclera are noninjected.  PERRL.  Ears and nose without any masses or lesions.  Mouth is pink and moist Heart: regular, rate, and rhythm.  No obvious murmurs, gallops, or rubs noted.  Palpable pedal pulses bilaterally Lungs: CTAB, no wheezes, rhonchi, or rales noted.  Respiratory effort nonlabored Abd: soft, NT/ND, +BS, no masses, hernias, or organomegaly MS: all 4  extremities are symmetrical with no cyanosis, clubbing, or edema. Skin: Multiple tattoos noted, warm and dry with no masses, or rashes.  7cm x 5cm indurated, erythematous area under her left axilla with noted fluctuance (walled off area), 3 punctum's noted superior at the axillary crease which are not draining Psych: A&Ox3 with an appropriate affect.   Initial picture from admission.  Currently her erythema/cellulitis has spread down her lateral chest wall and anteriorly to her lateral left breast towards the nipple and towards the back.  It is significantly beyond the blue line as it's shown in the picture.    Results for orders placed during the hospital encounter  of 03/07/14 (from the past 48 hour(s))  CBC     Status: Abnormal   Collection Time    03/09/14  9:15 AM      Result Value Ref Range   WBC 9.0  4.0 - 10.5 K/uL   RBC 3.19 (*) 3.87 - 5.11 MIL/uL   Hemoglobin 11.3 (*) 12.0 - 15.0 g/dL   HCT 33.1 (*) 36.0 - 46.0 %   MCV 103.8 (*) 78.0 - 100.0 fL   MCH 35.4 (*) 26.0 - 34.0 pg   MCHC 34.1  30.0 - 36.0 g/dL   RDW 12.8  11.5 - 15.5 %   Platelets 248  150 - 400 K/uL  CBC     Status: Abnormal   Collection Time    03/10/14 10:53 AM      Result Value Ref Range   WBC 10.0  4.0 - 10.5 K/uL   RBC 3.20 (*) 3.87 - 5.11 MIL/uL   Hemoglobin 11.2 (*) 12.0 - 15.0 g/dL   HCT 33.4 (*) 36.0 - 46.0 %   MCV 104.4 (*) 78.0 - 100.0 fL   MCH 35.0 (*) 26.0 - 34.0 pg   MCHC 33.5  30.0 - 36.0 g/dL   RDW 12.8  11.5 - 15.5 %   Platelets 303  150 - 400 K/uL   No results found.    Assessment/Plan Left axilla abscess Etoh abuse - on CIWA Nicotine abuse  Plan: 1.  Will need OR I&D of left axillary abscess tomorrow am 2.  NPO after MN, pain control, antiemetics, antibiotics (Zosyn Day #1 and Vancomycin Day #3) 3.  SCD's and lovenox for DVT proph but hold after midnight 4.  Ambulate 5.  Hot packs 6.  Maryann Alar, [facility] 03/10/2014, 1:28 PM Pager: [phone]

## 2014-03-10 NOTE — Progress Notes (Signed)
ANTIBIOTIC CONSULT NOTE - INITIAL  Pharmacy Consult:  Zosyn Indication:  Cellulitis  No Known Allergies  Patient Measurements: Height: 5\' 6"  (167.6 cm) Weight: 131 lb 15.8 oz (59.87 kg) IBW/kg (Calculated) : 59.3  Vital Signs: Temp: 98.7 F (37.1 C) (10/01 1035) Temp src: Oral (10/01 1035) BP: 130/70 mmHg (10/01 0938) Pulse Rate: 101 (10/01 1035) Intake/Output from previous day: 09/30 0701 - 10/01 0700 In: 680 [P.O.:480; IV Piggyback:200] Out: -   Labs:  Recent Labs  03/07/14 1850 03/08/14 0517 03/09/14 0915 03/10/14 1053  WBC  --  11.2* 9.0 10.0  HGB  --  11.6* 11.3* 11.2*  PLT  --  252 248 303  CREATININE 0.55 0.65  --   --    Estimated Creatinine Clearance: 84 ml/min (by C-G formula based on Cr of 0.65). No results found for this basename: VANCOTROUGH, VANCOPEAK, VANCORANDOM, [place], [place], [place], [place], [place], TOBRARND, AMIKACINPEAK, AMIKACINTROU, AMIKACIN,  in the last 72 hours   Microbiology: Recent Results (from the past 720 hour(s))  CULTURE, BLOOD (ROUTINE X 2)     Status: None   Collection Time    03/07/14  6:38 PM      Result Value Ref Range Status   Specimen Description BLOOD RIGHT ARM   Final   Special Requests BOTTLES DRAWN AEROBIC AND ANAEROBIC [place]   Final   Culture  Setup Time     Final   Value: 03/08/2014 08:00     Performed at [facility]   Culture     Final   Value:        BLOOD CULTURE RECEIVED NO GROWTH TO DATE CULTURE WILL BE HELD FOR 5 DAYS BEFORE ISSUING A FINAL NEGATIVE REPORT     Performed at [facility]   Report Status PENDING   Incomplete  CULTURE, BLOOD (ROUTINE X 2)     Status: None   Collection Time    03/07/14  6:50 PM      Result Value Ref Range Status   Specimen Description BLOOD LEFT HAND   Final   Special Requests BOTTLES DRAWN AEROBIC ONLY 3CC   Final   Culture  Setup Time     Final   Value: 03/07/2014 22:11     Performed at [facility]   Culture     Final   Value:         BLOOD CULTURE RECEIVED NO GROWTH TO DATE CULTURE WILL BE HELD FOR 5 DAYS BEFORE ISSUING A FINAL NEGATIVE REPORT     Performed at [facility]   Report Status PENDING   Incomplete    Medical History: Past Medical History  Diagnosis Date  . Cervical cancer     Dr. Anastasia Pall   . Cellulitis of axilla, left 03/07/2014  . Hypertension     hx  . High triglycerides     hx  . Asthma   . Arthritis     "neck" (03/07/2014)  . Anxiety   . Depression      Assessment: 33 YOF with cellulitis to continue on vancomycin and add Zosyn for broader coverage.  Her axillary redness has not improved.  Patient's renal function has been stable.  Vanc 9/28 >> Zosyn 10/1 >>  9/28 Blood - NGTD   Goal of Therapy:  Vanc trough:  10-15 mcg/mL   Plan:  - Zosyn 3.375gm IV Q8H, 4 hr infusion - Continue vanc 1gm IV Q12H - Monitor renal fxn, clinical course, vanc trough tomorrow  Verenis Nicosia D. Mina Marble, PharmD, BCPS Pager:  [phone] 03/10/2014, 11:55 AM

## 2014-03-11 ENCOUNTER — Encounter (HOSPITAL_COMMUNITY): Payer: Self-pay | Admitting: Certified Registered"

## 2014-03-11 ENCOUNTER — Encounter (HOSPITAL_COMMUNITY)
Admission: AD | Disposition: A | Discharge status: Home Health | Payer: Self-pay | Source: Ambulatory Visit | Attending: Family Medicine

## 2014-03-11 ENCOUNTER — Inpatient Hospital Stay (HOSPITAL_COMMUNITY): Payer: MEDICAID | Admitting: Certified Registered"

## 2014-03-11 ENCOUNTER — Encounter (HOSPITAL_COMMUNITY): Payer: MEDICAID | Admitting: Certified Registered"

## 2014-03-11 DIAGNOSIS — F1010 Alcohol abuse, uncomplicated: Secondary | ICD-10-CM

## 2014-03-11 HISTORY — PX: INCISION AND DRAINAGE ABSCESS: SHX5864

## 2014-03-11 LAB — VANCOMYCIN, TROUGH: Vancomycin Tr: 8.9 ug/mL — ABNORMAL LOW (ref 10.0–20.0)

## 2014-03-11 LAB — SURGICAL PCR SCREEN
MRSA, PCR: NEGATIVE
STAPHYLOCOCCUS AUREUS: NEGATIVE

## 2014-03-11 SURGERY — INCISION AND DRAINAGE, ABSCESS
Anesthesia: General | Site: Axilla | Laterality: Left

## 2014-03-11 MED ORDER — HYDROMORPHONE HCL 1 MG/ML IJ SOLN
0.2500 mg | INTRAMUSCULAR | Status: DC | PRN
  Administered 2014-03-11 (×4): 0.5 mg via INTRAVENOUS

## 2014-03-11 MED ORDER — ONDANSETRON HCL 4 MG/2ML IJ SOLN
INTRAMUSCULAR | Status: DC | PRN
  Administered 2014-03-11: 4 mg via INTRAVENOUS

## 2014-03-11 MED ORDER — HYDROMORPHONE HCL 1 MG/ML IJ SOLN
INTRAMUSCULAR | Status: AC
  Filled 2014-03-11: qty 1

## 2014-03-11 MED ORDER — PROMETHAZINE HCL 25 MG/ML IJ SOLN: 6.2500 mg | INTRAMUSCULAR | Status: DC | PRN

## 2014-03-11 MED ORDER — VANCOMYCIN HCL 10 G IV SOLR
1250.0000 mg | Freq: Two times a day (BID) | INTRAVENOUS | Status: DC
  Administered 2014-03-11 – 2014-03-13 (×4): 1250 mg via INTRAVENOUS
  Filled 2014-03-11 (×6): qty 1250

## 2014-03-11 MED ORDER — LIDOCAINE HCL (CARDIAC) 20 MG/ML IV SOLN
INTRAVENOUS | Status: AC
  Filled 2014-03-11: qty 5

## 2014-03-11 MED ORDER — LIDOCAINE HCL (CARDIAC) 20 MG/ML IV SOLN
INTRAVENOUS | Status: DC | PRN
  Administered 2014-03-11: 90 mg via INTRAVENOUS

## 2014-03-11 MED ORDER — ONDANSETRON HCL 4 MG/2ML IJ SOLN
INTRAMUSCULAR | Status: AC
  Filled 2014-03-11: qty 2

## 2014-03-11 MED ORDER — FENTANYL CITRATE 0.05 MG/ML IJ SOLN
INTRAMUSCULAR | Status: DC | PRN
  Administered 2014-03-11: 100 ug via INTRAVENOUS
  Administered 2014-03-11: 50 ug via INTRAVENOUS

## 2014-03-11 MED ORDER — ARTIFICIAL TEARS OP OINT
TOPICAL_OINTMENT | OPHTHALMIC | Status: AC
  Filled 2014-03-11: qty 3.5

## 2014-03-11 MED ORDER — PROPOFOL 10 MG/ML IV BOLUS
INTRAVENOUS | Status: AC
  Filled 2014-03-11: qty 20

## 2014-03-11 MED ORDER — OXYCODONE HCL 5 MG PO TABS
ORAL_TABLET | ORAL | Status: AC
  Filled 2014-03-11: qty 1

## 2014-03-11 MED ORDER — LACTATED RINGERS IV SOLN
INTRAVENOUS | Status: DC | PRN
  Administered 2014-03-11: 07:00:00 via INTRAVENOUS

## 2014-03-11 MED ORDER — ARTIFICIAL TEARS OP OINT
TOPICAL_OINTMENT | OPHTHALMIC | Status: DC | PRN
  Administered 2014-03-11: 1 via OPHTHALMIC

## 2014-03-11 MED ORDER — HYDROCODONE-ACETAMINOPHEN 5-325 MG PO TABS
1.0000 | ORAL_TABLET | Freq: Four times a day (QID) | ORAL | Status: DC
  Filled 2014-03-11: qty 1

## 2014-03-11 MED ORDER — OXYCODONE HCL 5 MG/5ML PO SOLN: 5.0000 mg | Freq: Once | ORAL | Status: AC | PRN

## 2014-03-11 MED ORDER — MIDAZOLAM HCL 2 MG/2ML IJ SOLN
INTRAMUSCULAR | Status: AC
  Filled 2014-03-11: qty 2

## 2014-03-11 MED ORDER — HYDROCODONE-ACETAMINOPHEN 5-325 MG PO TABS
1.0000 | ORAL_TABLET | Freq: Four times a day (QID) | ORAL | Status: DC
  Administered 2014-03-11 – 2014-03-12 (×5): 1 via ORAL
  Filled 2014-03-11 (×4): qty 1

## 2014-03-11 MED ORDER — 0.9 % SODIUM CHLORIDE (POUR BTL) OPTIME
TOPICAL | Status: DC | PRN
  Administered 2014-03-11: 1000 mL

## 2014-03-11 MED ORDER — DIPHENHYDRAMINE HCL 25 MG PO CAPS
25.0000 mg | ORAL_CAPSULE | Freq: Four times a day (QID) | ORAL | Status: DC | PRN
  Administered 2014-03-11: 25 mg via ORAL
  Filled 2014-03-11: qty 1

## 2014-03-11 MED ORDER — PROPOFOL 10 MG/ML IV BOLUS
INTRAVENOUS | Status: DC | PRN
  Administered 2014-03-11: 150 mg via INTRAVENOUS

## 2014-03-11 MED ORDER — FENTANYL CITRATE 0.05 MG/ML IJ SOLN
INTRAMUSCULAR | Status: AC
  Filled 2014-03-11: qty 5

## 2014-03-11 MED ORDER — OXYCODONE HCL 5 MG PO TABS
5.0000 mg | ORAL_TABLET | Freq: Once | ORAL | Status: AC | PRN
  Administered 2014-03-11: 5 mg via ORAL

## 2014-03-11 MED ORDER — MIDAZOLAM HCL 5 MG/5ML IJ SOLN
INTRAMUSCULAR | Status: DC | PRN
  Administered 2014-03-11: 2 mg via INTRAVENOUS

## 2014-03-11 SURGICAL SUPPLY — 30 items
BNDG CONFORM 2 STRL LF (GAUZE/BANDAGES/DRESSINGS) ×2 IMPLANT
BNDG GAUZE ELAST 4 BULKY (GAUZE/BANDAGES/DRESSINGS) ×2 IMPLANT
CANISTER SUCTION 2500CC (MISCELLANEOUS) ×3 IMPLANT
COVER SURGICAL LIGHT HANDLE (MISCELLANEOUS) ×3 IMPLANT
DRAPE LAPAROSCOPIC ABDOMINAL (DRAPES) ×3 IMPLANT
DRAPE UTILITY 15X26 W/TAPE STR (DRAPE) ×6 IMPLANT
DRSG PAD ABDOMINAL 8X10 ST (GAUZE/BANDAGES/DRESSINGS) ×3 IMPLANT
ELECT CAUTERY BLADE 6.4 (BLADE) ×3 IMPLANT
ELECT REM PT RETURN 9FT ADLT (ELECTROSURGICAL) ×3
ELECTRODE REM PT RTRN 9FT ADLT (ELECTROSURGICAL) ×1 IMPLANT
GAUZE SPONGE 4X4 12PLY STRL (GAUZE/BANDAGES/DRESSINGS) ×3 IMPLANT
GLOVE BIO SURGEON STRL SZ8 (GLOVE) ×3 IMPLANT
GLOVE BIOGEL PI IND STRL 8 (GLOVE) ×1 IMPLANT
GLOVE BIOGEL PI INDICATOR 8 (GLOVE) ×4
GOWN STRL REUS W/ TWL LRG LVL3 (GOWN DISPOSABLE) ×1 IMPLANT
GOWN STRL REUS W/ TWL XL LVL3 (GOWN DISPOSABLE) ×1 IMPLANT
GOWN STRL REUS W/TWL LRG LVL3 (GOWN DISPOSABLE) ×3
GOWN STRL REUS W/TWL XL LVL3 (GOWN DISPOSABLE) ×3
KIT BASIN OR (CUSTOM PROCEDURE TRAY) ×3 IMPLANT
KIT ROOM TURNOVER OR (KITS) ×3 IMPLANT
NS IRRIG 1000ML POUR BTL (IV SOLUTION) ×3 IMPLANT
PACK GENERAL/GYN (CUSTOM PROCEDURE TRAY) ×3 IMPLANT
PAD ABD 8X10 STRL (GAUZE/BANDAGES/DRESSINGS) ×2 IMPLANT
PAD ARMBOARD 7.5X6 YLW CONV (MISCELLANEOUS) ×3 IMPLANT
SPONGE GAUZE 4X4 12PLY STER LF (GAUZE/BANDAGES/DRESSINGS) ×2 IMPLANT
SWAB COLLECTION DEVICE MRSA (MISCELLANEOUS) ×2 IMPLANT
TAPE CLOTH SURG 6X10 WHT LF (GAUZE/BANDAGES/DRESSINGS) ×2 IMPLANT
TOWEL OR 17X24 6PK STRL BLUE (TOWEL DISPOSABLE) ×3 IMPLANT
TOWEL OR 17X26 10 PK STRL BLUE (TOWEL DISPOSABLE) ×1 IMPLANT
TUBE ANAEROBIC SPECIMEN COL (MISCELLANEOUS) ×2 IMPLANT

## 2014-03-11 NOTE — Transfer of Care (Signed)
Immediate Anesthesia Transfer of Care Note  Patient: Whitney Murphy  Procedure(s) Performed: Procedure(s): INCISION AND DRAINAGE ABSCESS (Left)  Patient Location: PACU  Anesthesia Type:General  Level of Consciousness: awake, alert  and oriented  Airway & Oxygen Therapy: Patient Spontanous Breathing and Patient connected to nasal cannula oxygen  Post-op Assessment: Report given to PACU RN, Post -op Vital signs reviewed and stable and Patient moving all extremities X 4  Post vital signs: Reviewed and stable  Complications: No apparent anesthesia complications

## 2014-03-11 NOTE — Progress Notes (Signed)
ANTIBIOTIC CONSULT NOTE - FOLLOW UP  Pharmacy Consult for Vancomycin  Indication: Cellulitis  No Known Allergies  Patient Measurements: Height: 5\' 6"  (167.6 cm) Weight: 131 lb 15.8 oz (59.87 kg) IBW/kg (Calculated) : 59.3  Labs:  Recent Labs  03/09/14 0915 03/10/14 1053  WBC 9.0 10.0  HGB 11.3* 11.2*  PLT 248 303    Recent Labs  03/11/14 0536  VANCOTROUGH 8.9*     Assessment: Sub-therapeutic vancomycin trough, drawn correctly  Goal of Therapy:  Vancomycin trough level 10-15 mcg/ml  Plan:  -Increase vancomycin to 1250 mg IV q12h -Repeat VT as clinically indicated  -F/U length of treatment  Narda Bonds 03/11/2014,6:28 AM

## 2014-03-11 NOTE — Anesthesia Procedure Notes (Signed)
Procedure Name: LMA Insertion Date/Time: 03/11/2014 7:27 AM Performed by: Gaylene Brooks Pre-anesthesia Checklist: Patient identified, Timeout performed, Emergency Drugs available, Suction available and Patient being monitored Patient Re-evaluated:Patient Re-evaluated prior to inductionOxygen Delivery Method: Circle system utilized Preoxygenation: Pre-oxygenation with 100% oxygen Intubation Type: IV induction Ventilation: Mask ventilation without difficulty LMA: LMA inserted LMA Size: 4.0 Number of attempts: 1 Placement Confirmation: positive ETCO2 and breath sounds checked- equal and bilateral Tube secured with: Tape Dental Injury: Teeth and Oropharynx as per pre-operative assessment

## 2014-03-11 NOTE — Progress Notes (Signed)
Family Medicine Teaching Service Daily Progress Note Intern Pager: [phone]  Patient name: Whitney Murphy Medical record number: [id number] Date of birth: 1971/01/26 Age: 43 y.o. Gender: female  Primary Care Provider: Arlyss Queen, MD Consultants: None Code Status: Full  Pt Overview and Major Events to Date:  9/28: Admitted for cellulitis  9/30: continue vanc, will transition tomorrow 10/1: consult surgery; zosyn started 10/2: I&D of abscess  Assessment and Plan: Whitney Murphy is a 43 y.o. female with no significant PMH who presented from Urgent Care with left axilla cellulitis.  #Left axilla cellulitis: Leukocytosis resolved, patient has been afebrile since hospital day 1. S/p I&D of large abscess in left axilla. - Surgery drained abscess on 03/11/14, appreciate their help - IV vancomycin per pharmacy (9/28 - ) - Added IV Zosyn to cover gram negs, anaerobes (10/1 - ) - BCx x2 NGTD - For pain: Tylenol 650 mg PO q6 hrs, Norco 5-325 mg PO q6 hrs PRN with senna-docusate PRN constipation  #Nicotine abuse: Pt endorses smoking about 1 ppd and would like a nicotine patch. Roughly 24 pack year history.  - Nicoderm patch 7 mg q24 hrs   #Alcohol abuse: Pt endorses drinking about 20 beers a week.  - CIWA protocol - Removed ativan as patient was requesting it and CIWA scores were not elevated.   FEN/GI: MIVF discontinued as Pt had good PO intake; Heart healthy diet  Prophylaxis: Lovenox 40 mg SQ q24 hrs  Disposition: Transition to PO abx and home once surgery clears Pt for discharge  Subjective: Saw patient after she had just returned from I&D. Feeling better, not currently in pain. Only complaint was itchy so ordered PRN PO Benadryl.  Objective: Temp:  [98.2 F (36.8 C)-98.7 F (37.1 C)] 98.7 F (37.1 C) (10/02 0855) Pulse Rate:  [84-101] 86 (10/02 0853) Resp:  [15-20] 16 (10/02 0853) BP: (110-130)/(51-80) 116/72 mmHg (10/02 0853) SpO2:  [94 %-99 %] 94 % (10/02 0853)  Physical  Exam: General: sitting up in bed, NAD Cardiovascular: RRR, no murmurs heard Respiratory: CTAB, normal work of breathing Abdomen: soft, non-tender, non-distended Extremities: left axilla with clean/dry/intact dressing in place. Did not remove as just had been placed by surgery s/p I&D. Erythema still clearing.   Laboratory:  Recent Labs Lab 03/08/14 0517 03/09/14 0915 03/10/14 1053  WBC 11.2* 9.0 10.0  HGB 11.6* 11.3* 11.2*  HCT 33.8* 33.1* 33.4*  PLT 252 248 303    Recent Labs Lab 03/07/14 1850 03/08/14 0517  NA 134* 134*  K 4.2 3.8  CL 97 98  CO2 22 24  BUN 11 8  CREATININE 0.55 0.65  CALCIUM 8.2* 7.9*  GLUCOSE 140* 96   Blood Cx: NGTD  Dineen Kid, Medical Student (MS4) Pages welcome: [phone]    I have seen and examined the patient. My patient encounter, physical exam, assessment and plan follow.  Assessment and Plan: Whitney Murphy is a 43 y.o. female with no significant PMH who with left axilla cellulitis and abscess s/p I&D.  # Abscess: General surgery performed I&D under general anesthesia today. Cultures obtained and pending. Patient tolerated the procedure well. Pain is adequately controlled. - continue vancomycin/zosyn - trend fever curve - Norco for pain with Sennakot-s for prophylaxis - gram stain - probable transition to PO antibiotics (Doxycycline and Augmentin) - trend CBCs  # Cellulitis - managed per above problem  # Pruritis: generalized without rash noted - benadryl 25mg  cap q6hours PRN  # Nicotine abuse - Nicoderm patch daily  #  Alcohol abuse: initially on CIWA without significant withdrawal symptoms - continue to monitor  FEN/GI: KVO, regular diet PPx: SCDs  Disposition: Home pending transition to oral antibiotics  Subjective:  Patient states she feels better today. She is s/p I&D in the OR under general anesthesia. Pain is currently controlled with some symptoms of itching. Otherwise no other  complaints.  Objective: Temp:  [98 F (36.7 C)-99 F (37.2 C)] 99 F (37.2 C) (10/02 1259) Pulse Rate:  [76-95] 76 (10/02 1259) Resp:  [15-20] 15 (10/02 1259) BP: (110-129)/(51-77) 119/65 mmHg (10/02 1259) SpO2:  [94 %-99 %] 97 % (10/02 1259) Physical Exam: General: Well appearing, in no distress. Husband in room.  Cardiovascular: Regular rate and rhythm without murmur Respiratory: Clear to auscultation bilaterally without wheezing Abdomen: Soft, non-tender, non-distended Extremities: No edema or calf tenderness Skin: left axilla with wound dressing in place as she is post op a few hours  Cordelia Poche, MD 03/11/2014, 1:54 PM PGY-2, [place] Intern pager: [phone], text pages welcome.

## 2014-03-11 NOTE — Op Note (Addendum)
03/07/2014 - 03/11/2014  8:07 AM  PATIENT:  Whitney Murphy  43 y.o. female  PRE-OPERATIVE DIAGNOSIS:  Large left axillary abscess, 5 cm  POST-OPERATIVE DIAGNOSIS:  Large left axillary abscess, 5 cm  PROCEDURE:  Procedure(s): INCISION AND DRAINAGE LARGE LEFT AXILLARY ABSCESS  SURGEON:  Surgeon(s): Georganna Skeans, MD  ASSISTANTS: none   ANESTHESIA:   general  EBL:     BLOOD ADMINISTERED:none  DRAINS: none   SPECIMEN:  No Specimen  DISPOSITION OF SPECIMEN:  N/A  COUNTS:  YES  DICTATION: .Dragon Dictation Patient is brought for incision and drainage of left axillary abscess. She was in the preop holding area. Her site was marked. Informed consent was obtained. She is on IV antibiotic protocol. She was brought to the operating room and general anesthesia with laryngeal mask airway was administered by the anesthesia staff. Her left axilla was prepped and draped in sterile fashion. We did time out procedure. Inspection of the axilla revealed a small bud of granulation type tissue more cephalad and then a large indurated abscess below. The small granulation blood was consistent with possible hidradenitis type inflammation. This was excised. It seemed to track down into the lower abscess cavity. Next, a large incision was made transversely across the abscess with spontaneous evacuation of copious pus. This was sent for cultures. Was copiously irrigated with saline. All loculations were broken apart. Was further irrigated. Hemostasis was obtained with cautery and was packed with saline soaked Kling gauze. All counts were correct.Bulky sterile dressing was applied. Patient tolerated the procedure well without apparent complication. She was taken recovery in stable condition.  PATIENT DISPOSITION:  PACU - hemodynamically stable.   Delay start of Pharmacological VTE agent (>24hrs) due to surgical blood loss or risk of bleeding:  no  Georganna Skeans, MD, MPH, FACS Pager: [phone]   10/2/20158:07 AM

## 2014-03-11 NOTE — Progress Notes (Signed)
Day of Surgery  Subjective: In pre-op  Objective: Vital signs in last 24 hours: Temp:  [98.2 F (36.8 C)-98.7 F (37.1 C)] 98.2 F (36.8 C) (10/02 0542) Pulse Rate:  [84-101] 90 (10/02 0542) Resp:  [15-20] 15 (10/02 0542) BP: (110-130)/(51-80) 129/74 mmHg (10/02 0542) SpO2:  [95 %-99 %] 99 % (10/02 0542) Last BM Date: 03/09/14  Intake/Output from previous day:   Intake/Output this shift:    no change indurated, erythematous L axilla  Lab Results:   Recent Labs  03/09/14 0915 03/10/14 1053  WBC 9.0 10.0  HGB 11.3* 11.2*  HCT 33.1* 33.4*  PLT 248 303   BMET No results found for this basename: NA, K, CL, CO2, GLUCOSE, BUN, CREATININE, CALCIUM,  in the last 72 hours PT/INR No results found for this basename: LABPROT, INR,  in the last 72 hours ABG No results found for this basename: PHART, PCO2, PO2, HCO3,  in the last 72 hours  Studies/Results: No results found.  Anti-infectives: Anti-infectives   Start     Dose/Rate Route Frequency Ordered Stop   03/11/14 1800  vancomycin (VANCOCIN) 1,250 mg in sodium chloride 0.9 % 250 mL IVPB     1,250 mg 166.7 mL/hr over 90 Minutes Intravenous Every 12 hours 03/11/14 0630     03/10/14 1100  [MAR Hold]  piperacillin-tazobactam (ZOSYN) IVPB 3.375 g     (On MAR Hold since 03/11/14 0628)   3.375 g 12.5 mL/hr over 240 Minutes Intravenous Every 8 hours 03/10/14 1003     03/08/14 0600  [MAR Hold]  vancomycin (VANCOCIN) IVPB 1000 mg/200 mL premix  Status:  Discontinued     (On MAR Hold since 03/11/14 0628)   1,000 mg 200 mL/hr over 60 Minutes Intravenous Every 12 hours 03/07/14 2012 03/11/14 0630   03/07/14 2300  piperacillin-tazobactam (ZOSYN) IVPB 3.375 g  Status:  Discontinued     3.375 g 12.5 mL/hr over 240 Minutes Intravenous 3 times per day 03/07/14 1641 03/07/14 1642   03/07/14 1700  vancomycin (VANCOCIN) IVPB 1000 mg/200 mL premix     1,000 mg 200 mL/hr over 60 Minutes Intravenous  Once 03/07/14 1641 03/07/14 1955   03/07/14 1645  piperacillin-tazobactam (ZOSYN) IVPB 3.375 g  Status:  Discontinued     3.375 g 100 mL/hr over 30 Minutes Intravenous  Once 03/07/14 1641 03/07/14 1642      Assessment/Plan: L axillary abscess - for I&D in the OR. Consent done last night. We again spoke about the procedure, risks, and benefits as well as the expected post-op course. Site marked. She agrees.  LOS: 4 days    Griselle Rufer E 03/11/2014

## 2014-03-11 NOTE — Anesthesia Preprocedure Evaluation (Addendum)
Anesthesia Evaluation  Patient identified by MRN, date of birth, ID band Patient awake    Reviewed: Allergy & Precautions, H&P , NPO status , Patient's Chart, lab work & pertinent test results  Airway Mallampati: I TM Distance: >3 FB Neck ROM: Full    Dental  (+) Teeth Intact, Dental Advisory Given   Pulmonary asthma , Current Smoker,  breath sounds clear to auscultation        Cardiovascular hypertension, Rhythm:Regular Rate:Normal     Neuro/Psych    GI/Hepatic (+)     substance abuse  alcohol use,   Endo/Other    Renal/GU      Musculoskeletal   Abdominal   Peds  Hematology   Anesthesia Other Findings   Reproductive/Obstetrics                          Anesthesia Physical Anesthesia Plan  ASA: II  Anesthesia Plan: General   Post-op Pain Management:    Induction: Intravenous  Airway Management Planned: LMA  Additional Equipment:   Intra-op Plan:   Post-operative Plan: Extubation in OR  Informed Consent: I have reviewed the patients History and Physical, chart, labs and discussed the procedure including the risks, benefits and alternatives for the proposed anesthesia with the patient or authorized representative who has indicated his/her understanding and acceptance.   Dental advisory given  Plan Discussed with: CRNA and Surgeon  Anesthesia Plan Comments:         Anesthesia Quick Evaluation

## 2014-03-11 NOTE — Anesthesia Postprocedure Evaluation (Signed)
  Anesthesia Post-op Note  Patient: Whitney Murphy  Procedure(s) Performed: Procedure(s): INCISION AND DRAINAGE ABSCESS (Left)  Patient Location: PACU  Anesthesia Type:General  Level of Consciousness: awake and alert   Airway and Oxygen Therapy: Patient Spontanous Breathing  Post-op Pain: mild  Post-op Assessment: Post-op Vital signs reviewed  Post-op Vital Signs: stable  Last Vitals:  Filed Vitals:   03/11/14 0855  BP:   Pulse:   Temp: 37.1 C  Resp:     Complications: No apparent anesthesia complications

## 2014-03-11 NOTE — Progress Notes (Signed)
Seen and examined yesterday.  Agree with Dr. Theodoro Grist documentation and management.

## 2014-03-11 NOTE — Progress Notes (Signed)
Report given to Donnamarie Poag, LPN

## 2014-03-12 DIAGNOSIS — L03112 Cellulitis of left axilla: Secondary | ICD-10-CM

## 2014-03-12 LAB — CBC
HCT: 31.9 % — ABNORMAL LOW (ref 36.0–46.0)
Hemoglobin: 10.8 g/dL — ABNORMAL LOW (ref 12.0–15.0)
MCH: 34.7 pg — ABNORMAL HIGH (ref 26.0–34.0)
MCHC: 33.9 g/dL (ref 30.0–36.0)
MCV: 102.6 fL — ABNORMAL HIGH (ref 78.0–100.0)
PLATELETS: 359 10*3/uL (ref 150–400)
RBC: 3.11 MIL/uL — ABNORMAL LOW (ref 3.87–5.11)
RDW: 12.9 % (ref 11.5–15.5)
WBC: 7.5 10*3/uL (ref 4.0–10.5)

## 2014-03-12 MED ORDER — MORPHINE SULFATE 4 MG/ML IJ SOLN
4.0000 mg | Freq: Once | INTRAMUSCULAR | Status: AC
Start: 2014-03-12 — End: 2014-03-12
  Administered 2014-03-12: 4 mg via INTRAVENOUS
  Filled 2014-03-12: qty 1

## 2014-03-12 MED ORDER — HYDROCODONE-ACETAMINOPHEN 5-325 MG PO TABS
1.0000 | ORAL_TABLET | Freq: Four times a day (QID) | ORAL | Status: DC
  Administered 2014-03-12 – 2014-03-13 (×4): 2 via ORAL
  Filled 2014-03-12 (×4): qty 2

## 2014-03-12 MED ORDER — FENTANYL CITRATE 0.05 MG/ML IJ SOLN
100.0000 ug | Freq: Once | INTRAMUSCULAR | Status: AC
  Administered 2014-03-12: 100 ug via INTRAVENOUS
  Filled 2014-03-12: qty 2

## 2014-03-12 NOTE — Progress Notes (Signed)
Seen and examined.  Agree with Dr. Berkley Harvey.  Much less cellulitis.  Packing drainage is no more than expected.

## 2014-03-12 NOTE — Progress Notes (Signed)
Seen and examined on 10/2.  Agree with Dr. Lisbeth Ply documentation and management.  Feeling much better post surgical drainage.

## 2014-03-12 NOTE — Progress Notes (Signed)
1 Day Post-Op  Subjective: Wants note for work   Objective: Vital signs in last 24 hours: Temp:  [97.8 F (36.6 C)-99 F (37.2 C)] 97.8 F (36.6 C) (10/03 0556) Pulse Rate:  [71-86] 71 (10/03 0556) Resp:  [15-17] 16 (10/03 0556) BP: (104-123)/(55-79) 123/79 mmHg (10/03 0556) SpO2:  [95 %-98 %] 95 % (10/03 0556) Last BM Date: 03/09/14  Intake/Output from previous day: 10/02 0701 - 10/03 0700 In: 1120 [P.O.:220; I.V.:900] Out: 450 [Urine:450] Intake/Output this shift:    Incision/Wound:dressing dry with no drainage  Lab Results:   Recent Labs  03/10/14 1053 03/12/14 0520  WBC 10.0 7.5  HGB 11.2* 10.8*  HCT 33.4* 31.9*  PLT 303 359   BMET No results found for this basename: NA, K, CL, CO2, GLUCOSE, BUN, CREATININE, CALCIUM,  in the last 72 hours PT/INR No results found for this basename: LABPROT, INR,  in the last 72 hours ABG No results found for this basename: PHART, PCO2, PO2, HCO3,  in the last 72 hours  Studies/Results: No results found.  Anti-infectives: Anti-infectives   Start     Dose/Rate Route Frequency Ordered Stop   03/11/14 1800  vancomycin (VANCOCIN) 1,250 mg in sodium chloride 0.9 % 250 mL IVPB     1,250 mg 166.7 mL/hr over 90 Minutes Intravenous Every 12 hours 03/11/14 0630     03/10/14 1100  piperacillin-tazobactam (ZOSYN) IVPB 3.375 g     3.375 g 12.5 mL/hr over 240 Minutes Intravenous Every 8 hours 03/10/14 1003     03/08/14 0600  [MAR Hold]  vancomycin (VANCOCIN) IVPB 1000 mg/200 mL premix  Status:  Discontinued     (On MAR Hold since 03/11/14 0628)   1,000 mg 200 mL/hr over 60 Minutes Intravenous Every 12 hours 03/07/14 2012 03/11/14 0630   03/07/14 2300  piperacillin-tazobactam (ZOSYN) IVPB 3.375 g  Status:  Discontinued     3.375 g 12.5 mL/hr over 240 Minutes Intravenous 3 times per day 03/07/14 1641 03/07/14 1642   03/07/14 1700  vancomycin (VANCOCIN) IVPB 1000 mg/200 mL premix     1,000 mg 200 mL/hr over 60 Minutes Intravenous  Once  03/07/14 1641 03/07/14 1955   03/07/14 1645  piperacillin-tazobactam (ZOSYN) IVPB 3.375 g  Status:  Discontinued     3.375 g 100 mL/hr over 30 Minutes Intravenous  Once 03/07/14 1641 03/07/14 1642      Assessment/Plan: s/p Procedure(s): INCISION AND DRAINAGE ABSCESS (Left) Start dressing changes today Arrange for [facility] for wound care No lifting for 10 days Can go home Sunday and follow up with DOW clinic 1 week  LOS: 5 days    Errica Dutil A. 03/12/2014

## 2014-03-12 NOTE — Progress Notes (Signed)
Family Medicine Teaching Service Daily Progress Note Intern Pager: [phone]  Patient name: Whitney Murphy Medical record number: [id number] Date of birth: 29-Aug-1970 Age: 43 y.o. Gender: female  Primary Care Provider: Arlyss Queen, MD Consultants: None Code Status: Full  Pt Overview and Major Events to Date:  9/28: Admitted for cellulitis  9/30: continue vanc, will transition tomorrow 10/1: consult surgery; zosyn started 10/2: I&D of abscess 10/3: Switched to PO pain meds; Likely transition to PO abx tomorrow  Assessment and Plan: Whitney Murphy is a 43 y.o. female with no significant PMH who presented from Urgent Care with left axilla cellulitis.  #Left axilla cellulitis: Leukocytosis resolved, patient has been afebrile since hospital day 1. S/p I&D of large abscess in left axilla. - Surgery drained abscess on 03/11/14, appreciate their help - Abx: IV vancomycin per pharmacy (9/28 - ); Added IV Zosyn to cover gram negs, anaerobes (10/1 - ) - BCx x2 NGTD - For pain: Norco 5-325 mg 1-2 tabs PO q6 hrs PRN with senna-docusate PRN constipation - Morphine once prior to dressing change  #Nicotine abuse: Pt endorses smoking about 1 ppd and would like a nicotine patch. Roughly 24 pack year history.  - Nicoderm patch 7 mg q24 hrs   #Alcohol abuse: Pt endorses drinking about 20 beers a week.  - CIWA protocol - Removed ativan as patient was requesting it and CIWA scores were not elevated.   FEN/GI: KVO Heart healthy diet  Prophylaxis: Lovenox 40 mg SQ q24 hrs  Disposition: Transition to PO abx and home once surgery clears Pt for discharge  Subjective: Patient complains of extreme pain with dressing change; waiting of IV pain meds prior to attempting dressing change. Otherwise denies fevers, chills. Pain was 1/10 prior to attempting change.   Objective: Temp:  [97.8 F (36.6 C)-99 F (37.2 C)] 97.8 F (36.6 C) (10/03 0556) Pulse Rate:  [71-80] 71 (10/03 0556) Resp:  [15-17] 16 (10/03  0556) BP: (104-123)/(55-79) 123/79 mmHg (10/03 0556) SpO2:  [95 %-98 %] 95 % (10/03 0556)  Physical Exam: General: sitting up in bed, NAD Cardiovascular: RRR, no murmurs heard Respiratory: CTAB, normal work of breathing Abdomen: soft, non-tender, non-distended Extremities: left axilla with clean/dry/intact dressing in place; Erythema still clearing.  Laboratory:  Recent Labs Lab 03/09/14 0915 03/10/14 1053 03/12/14 0520  WBC 9.0 10.0 7.5  HGB 11.3* 11.2* 10.8*  HCT 33.1* 33.4* 31.9*  PLT 248 303 359    Recent Labs Lab 03/07/14 1850 03/08/14 0517  NA 134* 134*  K 4.2 3.8  CL 97 98  CO2 22 24  BUN 11 8  CREATININE 0.55 0.65  CALCIUM 8.2* 7.9*  GLUCOSE 140* 96   Blood Cx: NGTD I&D Cx: Pending  Olam Idler, MD 03/12/2014, 11:49 AM PGY-2, [place] Intern pager: [phone], text pages welcome.

## 2014-03-13 LAB — CULTURE, BLOOD (ROUTINE X 2): Culture: NO GROWTH

## 2014-03-13 MED ORDER — MORPHINE SULFATE 4 MG/ML IJ SOLN: 4.0000 mg | Freq: Once | INTRAMUSCULAR | Status: DC

## 2014-03-13 MED ORDER — DOXYCYCLINE HYCLATE 100 MG PO TABS: 100.0000 mg | ORAL_TABLET | Freq: Two times a day (BID) | ORAL | Status: DC

## 2014-03-13 MED ORDER — HYDROCODONE-ACETAMINOPHEN 5-325 MG PO TABS: 1.0000 | ORAL_TABLET | Freq: Four times a day (QID) | ORAL | Status: DC

## 2014-03-13 MED ORDER — HYDROXYZINE PAMOATE 50 MG PO CAPS: 50.0000 mg | ORAL_CAPSULE | Freq: Three times a day (TID) | ORAL | Status: DC | PRN

## 2014-03-13 MED ORDER — SENNOSIDES-DOCUSATE SODIUM 8.6-50 MG PO TABS: 1.0000 | ORAL_TABLET | Freq: Every evening | ORAL | Status: DC | PRN

## 2014-03-13 MED ORDER — FENTANYL CITRATE 0.05 MG/ML IJ SOLN
100.0000 ug | Freq: Once | INTRAMUSCULAR | Status: AC
  Administered 2014-03-13: 100 ug via INTRAVENOUS
  Filled 2014-03-13: qty 2

## 2014-03-13 MED ORDER — DOXYCYCLINE HYCLATE 100 MG PO TABS
100.0000 mg | ORAL_TABLET | Freq: Two times a day (BID) | ORAL | Status: DC
  Administered 2014-03-13: 100 mg via ORAL
  Filled 2014-03-13: qty 1

## 2014-03-13 NOTE — Care Management Note (Signed)
    Page 1 of 1   03/13/2014     4:00:44 PM CARE MANAGEMENT NOTE 03/13/2014  Patient:  Whitney Murphy,Whitney Murphy   Account Number:  [id number]  Date Initiated:  03/08/2014  Documentation initiated by:  Olga Coaster  Subjective/Objective Assessment:   ADMITTED WITH CELLULITIS     Action/Plan:   CM FOLLOWING FOR DCP   Anticipated DC Date:  03/12/2014   Anticipated DC Plan:  [place]  CM consult      [facility] Choice  HOME HEALTH   Choice offered to / List presented to:  C-1 Patient        [place] arranged  HH-1 RN      [place].   Status of service:  Completed, signed off Medicare Important Message given?   (If response is "NO", the following Medicare IM given date fields will be blank) Date Medicare IM given:   Medicare IM given by:   Date Additional Medicare IM given:   Additional Medicare IM given by:    Discharge Disposition:  [place]  Per UR Regulation:  Reviewed for med. necessity/level of care/duration of stay  If discussed at [place] of Stay Meetings, dates discussed:    Comments:  03/13/14 09:04 CM met with pt to give pt [place] pamphlet and pt verbalized understanding she is to go to the clinic any weekday morning between the hour 68f9-10am and ask for: [place] ASan [place]  address and contact information verified with pt.  Md states he [place] [facility] [place] and MGarrettnot necessary. [facility]  No other CM needs were communicated.  SMariane Masters BDurel [phone]

## 2014-03-13 NOTE — Progress Notes (Signed)
2 Days Post-Op  Subjective: Many complaints  Objective: Vital signs in last 24 hours: Temp:  [98 F (36.7 C)-98.5 F (36.9 C)] 98 F (36.7 C) (10/04 0619) Pulse Rate:  [75-84] 75 (10/04 0619) Resp:  [16-18] 18 (10/04 0619) BP: (139-147)/(81-89) 139/89 mmHg (10/04 0619) SpO2:  [98 %-100 %] 99 % (10/04 0619) Last BM Date: 03/09/14  Intake/Output from previous day: 10/03 0701 - 10/04 0700 In: 360 [P.O.:360] Out: -  Intake/Output this shift:    Incision/Wound:dressing intact less erythema some drainage  Expected.   Lab Results:   Recent Labs  03/10/14 1053 03/12/14 0520  WBC 10.0 7.5  HGB 11.2* 10.8*  HCT 33.4* 31.9*  PLT 303 359   BMET No results found for this basename: NA, K, CL, CO2, GLUCOSE, BUN, CREATININE, CALCIUM,  in the last 72 hours PT/INR No results found for this basename: LABPROT, INR,  in the last 72 hours ABG No results found for this basename: PHART, PCO2, PO2, HCO3,  in the last 72 hours  Studies/Results: No results found.  Anti-infectives: Anti-infectives   Start     Dose/Rate Route Frequency Ordered Stop   03/11/14 1800  vancomycin (VANCOCIN) 1,250 mg in sodium chloride 0.9 % 250 mL IVPB     1,250 mg 166.7 mL/hr over 90 Minutes Intravenous Every 12 hours 03/11/14 0630     03/10/14 1100  piperacillin-tazobactam (ZOSYN) IVPB 3.375 g     3.375 g 12.5 mL/hr over 240 Minutes Intravenous Every 8 hours 03/10/14 1003     03/08/14 0600  [MAR Hold]  vancomycin (VANCOCIN) IVPB 1000 mg/200 mL premix  Status:  Discontinued     (On MAR Hold since 03/11/14 0628)   1,000 mg 200 mL/hr over 60 Minutes Intravenous Every 12 hours 03/07/14 2012 03/11/14 0630   03/07/14 2300  piperacillin-tazobactam (ZOSYN) IVPB 3.375 g  Status:  Discontinued     3.375 g 12.5 mL/hr over 240 Minutes Intravenous 3 times per day 03/07/14 1641 03/07/14 1642   03/07/14 1700  vancomycin (VANCOCIN) IVPB 1000 mg/200 mL premix     1,000 mg 200 mL/hr over 60 Minutes Intravenous  Once  03/07/14 1641 03/07/14 1955   03/07/14 1645  piperacillin-tazobactam (ZOSYN) IVPB 3.375 g  Status:  Discontinued     3.375 g 100 mL/hr over 30 Minutes Intravenous  Once 03/07/14 1641 03/07/14 1642      Assessment/Plan: s/p Procedure(s): INCISION AND DRAINAGE ABSCESS (Left) Can go home at any time once HHN in place.  Will need pain meds and abx po.  Follow up 7 - 10 days with [facility] clinic.   LOS: 6 days    Whitney Murphy A. 03/13/2014

## 2014-03-13 NOTE — Progress Notes (Signed)
Per [facility] lab, abscess is growing MRSA, MD notified. Syliva Overman

## 2014-03-13 NOTE — Progress Notes (Signed)
Seen and examined.  Agree with the documentation and management of Dr. Berkley Harvey.  Ready for DC today.

## 2014-03-13 NOTE — Progress Notes (Signed)
Family Medicine Teaching Service Daily Progress Note Intern Pager: [phone]  Patient name: Whitney Murphy Medical record number: [id number] Date of birth: Oct 27, 1970 Age: 43 y.o. Gender: female  Primary Care Provider: Arlyss Queen, MD Consultants: None Code Status: Full  Pt Overview and Major Events to Date:  9/28: Admitted for cellulitis  9/30: continue vanc, will transition tomorrow 10/1: consult surgery; zosyn started 10/2: I&D of abscess 10/3: Switched to PO pain meds; Likely transition to PO abx tomorrow 10/4: I&D culture = MRSA; Switched to Doxy PO  Assessment and Plan: Whitney Murphy is a 43 y.o. female with no significant PMH who presented from Urgent Care with left axilla cellulitis.  #Left axilla cellulitis: Leukocytosis resolved, patient has been afebrile since hospital day 1. S/p I&D of large abscess in left axilla. - Surgery drained abscess on 03/11/14, appreciate their help - Abx: Doxy BID; s/p IV vancomycin (9/28 -10/4 );Zosyn (10/1-10/4 ) - BCx x2 NGTD - For pain: Norco 5-325 mg 1-2 tabs PO q6 hrs PRN with senna-docusate PRN constipation - Fentanyl prior to dressing change  #Nicotine abuse: Pt endorses smoking about 1 ppd and would like a nicotine patch. Roughly 24 pack year history.  - Nicoderm patch 7 mg q24 hrs   #Alcohol abuse: Pt endorses drinking about 20 beers a week.  - CIWA protocol - Removed ativan as patient was requesting it and CIWA scores were not elevated.   # Anxiety - Vistaril  FEN/GI: KVO Heart healthy diet  Prophylaxis: Lovenox 40 mg SQ q24 hrs  Disposition: Transition to PO abx and home once surgery clears Pt for discharge  Subjective: Patient continues to complain of pain with dressing change. Also endorse "nerves" due to being out of work and not having insurance which makes it hard for her to sleep.   Objective: Temp:  [98 F (36.7 C)-98.5 F (36.9 C)] 98 F (36.7 C) (10/04 0619) Pulse Rate:  [75-84] 75 (10/04 0619) Resp:   [16-18] 18 (10/04 0619) BP: (139-147)/(81-89) 139/89 mmHg (10/04 0619) SpO2:  [98 %-100 %] 99 % (10/04 5465)  Physical Exam: General: sitting up in bed, NAD Cardiovascular: RRR, no murmurs heard Respiratory: CTAB, normal work of breathing Abdomen: soft, non-tender, non-distended Extremities: left axilla with clean/dry/intact dressing in place; Erythema still clearing.  Laboratory:  Recent Labs Lab 03/09/14 0915 03/10/14 1053 03/12/14 0520  WBC 9.0 10.0 7.5  HGB 11.3* 11.2* 10.8*  HCT 33.1* 33.4* 31.9*  PLT 248 303 359    Recent Labs Lab 03/07/14 1850 03/08/14 0517  NA 134* 134*  K 4.2 3.8  CL 97 98  CO2 22 24  BUN 11 8  CREATININE 0.55 0.65  CALCIUM 8.2* 7.9*  GLUCOSE 140* 96   Blood Cx: NGTD I&D Cx: MRSA  Olam Idler, MD 03/13/2014, 9:44 AM PGY-2, [place] Intern pager: [phone], text pages welcome.

## 2014-03-13 NOTE — Discharge Instructions (Signed)
Abscess Care After An abscess (also called a boil or furuncle) is an infected area that contains a collection of pus. Signs and symptoms of an abscess include pain, tenderness, redness, or hardness, or you may feel a moveable soft area under your skin. An abscess can occur anywhere in the body. The infection may spread to surrounding tissues causing cellulitis. A cut (incision) by the surgeon was made over your abscess and the pus was drained out. Gauze may have been packed into the space to provide a drain that will allow the cavity to heal from the inside outwards. The boil may be painful for 5 to 7 days. Most people with a boil do not have high fevers. Your abscess, if seen early, may not have localized, and may not have been lanced. If not, another appointment may be required for this if it does not get better on its own or with medications. HOME CARE INSTRUCTIONS   Only take over-the-counter or prescription medicines for pain, discomfort, or fever as directed by your caregiver.  When you bathe, soak and then remove gauze or iodoform packs at least daily or as directed by your caregiver. You may then wash the wound gently with mild soapy water. Repack with gauze or do as your caregiver directs. SEEK IMMEDIATE MEDICAL CARE IF:   You develop increased pain, swelling, redness, drainage, or bleeding in the wound site.  You develop signs of generalized infection including muscle aches, chills, fever, or a general ill feeling.  An oral temperature above 102 F (38.9 C) develops, not controlled by medication. See your caregiver for a recheck if you develop any of the symptoms described above. If medications (antibiotics) were prescribed, take them as directed. Document Released: 12/13/2004 Document Revised: 08/19/2011 Document Reviewed: 08/10/2007 [facility] Patient Information 2015 [place], [place]. This information is not intended to replace advice given to you by your health care provider. Make sure  you discuss any questions you have with your health care provider.   Emergency Department Resource Guide 1) Find a Doctor and Pay Out of Pocket Although you won't have to find out who is covered by your insurance plan, it is a good idea to ask around and get recommendations. You will then need to call the office and see if the doctor you have chosen will accept you as a new patient and what types of options they offer for patients who are self-pay. Some doctors offer discounts or will set up payment plans for their patients who do not have insurance, but you will need to ask so you aren't surprised when you get to your appointment.  2) Contact Your Local Health Department Not all health departments have doctors that can see patients for sick visits, but many do, so it is worth a call to see if yours does. If you don't know where your local health department is, you can check in your phone book. The CDC also has a tool to help you locate your state's health department, and many state websites also have listings of all of their local health departments.  3) Find a [facility] If your illness is not likely to be very severe or complicated, you may want to try a walk in clinic. These are popping up all over the country in pharmacies, drugstores, and shopping centers. They're usually staffed by nurse practitioners or physician assistants that have been trained to treat common illnesses and complaints. They're usually fairly quick and inexpensive. However, if you have serious medical issues  or chronic medical problems, these are probably not your best option.  No Primary Care Doctor: - Call Health Connect at  [phone] - they can help you locate a primary care doctor that  accepts your insurance, provides certain services, etc. - Physician Referral Service- [phone]  Chronic Pain Problems: Organization         Address  Phone   Notes  [facility]  [phone] Patients need  to be referred by their primary care doctor.   Medication Assistance: Organization         Address  Phone   Notes  [facility] Medication [facility] [address] [place], [place] [address] [phone] --Must be a resident of [facility] -- Must have NO insurance coverage whatsoever (no Medicaid/ Medicare, etc.) -- The pt. MUST have a primary care doctor that directs their care regularly and follows them in the community   MedAssist  [phone]   [employer]  [phone]    Agencies that provide inexpensive medical care: Organization         Address  Phone   Notes  [place]  [phone]   [facility]    [phone]   [facility] [place], [place] [address] [phone]   [place] [address] [place] [phone]   Planned Parenthood    [phone]   [facility]    [phone]   [place] and [address], [place] Phone:  [phone], Fax:  [phone] Hours of Operation:  9 am - 6 pm, M-F.  Also accepts Medicaid/Medicare and self-pay.  [facility] for [place], [address], [place] Phone: [phone], Fax: [phone]. Hours of Operation:  8:30 am - 5:30 pm, M-F.  Also accepts Medicaid and self-pay.  [facility] [place] [address], [place] Phone: [phone]   [place], [place], [place] [phone], Ext. [phone] Mondays & Thursdays: 7-9 AM.  First 15 patients are seen on a first come, first serve basis.    [place] Providers:  Organization         Address  Phone   Notes  [facility] [address], [place] [phone] Also accepts self-pay patients.  [facility] [address], [place]  [phone]   [place], [address], [place] [phone]   [facility] [address], [place] [phone]   Lucianne Lei [address], [place]   [phone] Only accepts [place] Access [place] patients after they have their name applied to their card.   Self-Pay (no insurance) in [facility]:  Organization         Address  Phone   Notes  Sickle Cell Patients, [facility] [place] [phone]   [facility] Urgent Care [place] [phone]   [facility] [place]  [place], [place], [place] [phone]   [facility]/Dr. Osei-Bonsu  [address] [place] or [address], [place] [phone] Phone number for both [place] and [place] locations is the same.  [facility] [address] [place] [phone]   [facility] [address] [place] or [address] [phone]   [facility] [address] [place] [phone], phone; [phone], fax Sees patients 1st and 3rd Saturday of every month.  Must not qualify for public or private insurance (i.e. Medicaid, Medicare, [place] Health Choice, Veterans' Benefits)  Household income should be no more than 200% of the poverty level The clinic cannot treat you if you are pregnant or think you are pregnant  Sexually transmitted diseases are not treated at the clinic.    Dental Care: Organization         Address  Phone  Notes  [facility] Department of [facility] [place] [phone] Accepts children up to age 48 who are enrolled in [place] or [facility]; pregnant women with a Medicaid card; and children who have applied for Medicaid or [place] Health Choice, but were declined, whose  parents can pay a reduced fee at time of service.  [facility] Department of [facility]  [address], [place] [phone] Accepts children up to age 56 who are enrolled in [place] or [place]; pregnant women with a Medicaid card; and children who have applied for Medicaid or [place] Health Choice, but were declined, whose parents can pay a reduced fee at time of service.  [place] Adult Dental Access PROGRAM  [place] [phone] Patients are seen by appointment only. Walk-ins are not accepted. [place] will see patients 80 years of age and older. Monday - Tuesday (8am-5pm) Most Wednesdays (8:30-5pm) $30 per visit, cash only  [facility] Adult Dental Access PROGRAM  [address], [facility] [phone] Patients are seen by appointment only. Walk-ins are not accepted. [place] will see patients 73 years of age and older. One Wednesday Evening (Monthly: Volunteer Based).  $30 per visit, cash only  [place]  [phone] for adults; Children under age 23, call Graduate Pediatric Dentistry at [phone]. Children aged 63-14, please call [phone] to request a pediatric application.  Dental services are provided in all areas of dental care including fillings, crowns and bridges, complete and partial dentures, implants, gum treatment, root canals, and extractions. Preventive care is also provided. Treatment is provided to both adults and children. Patients are selected via a lottery and there is often a waiting list.   [facility] [address] [place]  [phone] [email or web address]   [facility] [address] [place], [place] [phone], Ext. [phone] Second and Fourth Thursday of each month, opens at 6:30 AM; Clinic ends at 9 AM.  Patients are seen on a first-come first-served basis, and a limited number are seen during each clinic.   [facility]   [address] Hillard Danker [place], [place] [phone]   Eligibility Requirements You must have lived in [place], [place], or [place] counties for at least the last three months.   You cannot be eligible for state or federal sponsored [employer], including [employer], [place], or [employer].   You generally cannot be eligible for healthcare insurance through your employer.    How to apply: Eligibility screenings are held every Tuesday and Wednesday afternoon from 1:00 pm until 4:00 pm. You do not need an appointment for the interview!  [facility] [address] [place], [place]   [place]  [place] Department  [place]  [phone]    Behavioral Health Resources in the Community: Intensive Outpatient Programs Organization         Address  Phone  Notes  [place]. [address], [place], [place] [phone]   [facility] Outpatient [address] [place], [place]   ADS: Alcohol & Drug Svcs [address], [place], [place]   [place] [address]  [place], [place] or [phone]   Substance Abuse Resources Organization         Address  Phone  Notes  Alcohol and Drug Services  [phone]   [place]  [phone]   The [place]   Chinita Pester  [phone]   Residential & Outpatient Substance Abuse Program  [phone]   Psychological Services Organization         Address  Phone  Notes  [facility] [place]  [place]  [phone]   [place] [address] [place] [address], [place] or [phone]    Mobile Crisis Teams Organization         Address  Phone  Notes  [facility], Mobile Crisis Care Unit  [phone]     [facility]ive Psychotherapeutic Services  [address] [place], [place]   [place] [address], [place] [phone]    Self-Help/Support Groups Organization         Address  Phone             Notes  [place]. of [place] - variety of support groups  [place] Call for more information  Narcotics Anonymous (NA), [facility] [address], [employer] [place]  2 meetings at this location   Special educational needs teacher         Address  Phone  Notes  [facility] Residential Treatment [place],    [place]  [phone]   [facility]  [address] [place] [address], [place], [place]   [place], [place] [phone] Admissions: 8am-3pm M-F  Incentives Substance [place] [address]    [place], [place] [phone]   [facility] [address] [place], [place], [place]   The [facility] [address],  [place], [place]   [facility]ight Programs - Intensive Outpatient [address] Kristeen [address], [place], [place]   [facility] ([place].,  [place], [place] [phone] or [phone]   [facility]idential Treatment Services (RTS) [address], [place], [place] Accepts Medicaid  [facility] [place] [address],  [place] [phone] Substance Abuse/Addiction Treatment   [facility] Organization         Address  Phone  Notes  [facility]  [phone]   Domenic Schwab, PhD [address] Arlis Porta [place], [place]   [phone] or [phone]   [facility] [address] [place], [place] [phone]   [place] [address] [place], [place] [phone] Insurance/Medicaid/sponsorship through [employer] and Families [address], [place]  [place], [place] [phone] [place], [place] [phone]    Dr. Adele Schilder  [phone]   [facility] Dept. 1) [address] [place] 2) [place] 3)  [place], [place] [phone]  [phone]   [place] [phone] or [phone] (After Hours)

## 2014-03-13 NOTE — Discharge Summary (Signed)
Seen and examined.  Agree with the documentation and management of Dr. Raliegh Ip.  She is ready to go home with antibiotics and wound care.

## 2014-03-14 ENCOUNTER — Encounter (HOSPITAL_COMMUNITY): Payer: Self-pay | Admitting: General Surgery

## 2014-03-14 LAB — CULTURE, ROUTINE-ABSCESS

## 2014-03-14 LAB — CULTURE, BLOOD (ROUTINE X 2): Culture: NO GROWTH

## 2014-03-16 ENCOUNTER — Ambulatory Visit: Payer: Self-pay | Attending: Internal Medicine | Admitting: Internal Medicine

## 2014-03-16 VITALS — BP 127/80 | HR 73 | Temp 97.9°F | Resp 16 | Ht 64.0 in | Wt 135.4 lb

## 2014-03-16 DIAGNOSIS — L03112 Cellulitis of left axilla: Secondary | ICD-10-CM | POA: Insufficient documentation

## 2014-03-16 DIAGNOSIS — F419 Anxiety disorder, unspecified: Secondary | ICD-10-CM | POA: Insufficient documentation

## 2014-03-16 DIAGNOSIS — F17210 Nicotine dependence, cigarettes, uncomplicated: Secondary | ICD-10-CM | POA: Insufficient documentation

## 2014-03-16 DIAGNOSIS — M79622 Pain in left upper arm: Secondary | ICD-10-CM | POA: Insufficient documentation

## 2014-03-16 LAB — ANAEROBIC CULTURE

## 2014-03-16 MED ORDER — HYDROXYZINE PAMOATE 50 MG PO CAPS: 50.0000 mg | ORAL_CAPSULE | Freq: Three times a day (TID) | ORAL | Status: DC | PRN

## 2014-03-16 MED ORDER — DOXYCYCLINE HYCLATE 100 MG PO TABS: 100.0000 mg | ORAL_TABLET | Freq: Two times a day (BID) | ORAL | Status: DC

## 2014-03-16 MED ORDER — HYDROCODONE-ACETAMINOPHEN 5-325 MG PO TABS: 1.0000 | ORAL_TABLET | Freq: Four times a day (QID) | ORAL | Status: DC

## 2014-03-16 NOTE — Progress Notes (Signed)
Patient presents to establish care  HFU cellulitis left axilla; rates pain 4/10 at present Declined flu vaccine Wants to quit smoking

## 2014-03-16 NOTE — Progress Notes (Signed)
[id number]   HPI:  Whitney Murphy is a 43 yo female with no significant PMH who was sent as a direct admit from Urgent Care with left axilla cellulitis with an underlying mass concerning for abscess. Blood cultures x2 were drawn and the patient was started on IV vancomycin. An ultrasound of her left upper extremity/chest was done on admission that showed no signs of abscess formation, only soft tissue edema. There was inadequate clinical response after 48 hours, so IV Zosyn was added and surgery was consulted for treatment of the mass which had become indurated and quite painful. Surgery incised and drained the mass, which had formed into an abscess. 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 The patient   discharged on doxycycline to complete a 10-day course as an outpatient. She endorsees compliance .  Home health ordered for wound care.  She complains of a significant amount of pain with dressing changes,  States that she has to take norco and ibuprofen alternately , and as about 20 pills of the norco left out of 60 tabs that she received , states that the packing is almost 2 feet long and has excruciating pain when the wound is packed . No fever , no purulent drainage     No Known Allergies Past Medical History  Diagnosis Date  . Cervical cancer     Dr. Anastasia Pall   . Cellulitis of axilla, left 03/07/2014  . Hypertension     hx  . High triglycerides     hx  . Asthma   . Arthritis     "neck" (03/07/2014)  . Anxiety   . Depression   . Nephrolithiasis   . Chronic ear infection   . Fractured coccyx     x2   Current Outpatient Prescriptions on File Prior to Visit  Medication Sig Dispense Refill  . diphenhydrAMINE (BENADRYL) 25 MG tablet Take 25 mg by mouth every 6 (six) hours as needed for allergies.      Marland Kitchen senna-docusate (SENOKOT-S) 8.6-50 MG per tablet Take 1 tablet by mouth at bedtime as needed for mild constipation.  30 tablet  0   No current facility-administered  medications on file prior to visit.   Family History  Problem Relation Age of Onset  . Depression Mother   . Heart disease Father     stent put in   History   Social History  . Marital Status: Married    Spouse Name: N/A    Number of Children: N/A  . Years of Education: N/A   Occupational History  . sales    Social History Main Topics  . Smoking status: Current Every Day Smoker -- 1.00 packs/day for 24 years    Types: Cigarettes  . Smokeless tobacco: Never Used  . Alcohol Use: 12.0 oz/week    10 Glasses of wine, 10 Cans of beer per week  . Drug Use: Yes     Comment: 03/07/2014 "marijuana roughly once a month"  . Sexual Activity: Yes   Other Topics Concern  . 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     Review of Systems  Constitutional: Negative for fever, chills, diaphoresis, activity change, appetite change and fatigue.  HENT: Negative for ear pain, nosebleeds, congestion, facial swelling, rhinorrhea, neck pain, neck stiffness and ear discharge.   Eyes: Negative for pain, discharge, redness, itching and visual disturbance.  Respiratory: Negative for  cough, choking, chest tightness, shortness of breath, wheezing and stridor.   Cardiovascular: Negative for chest pain, palpitations and leg swelling.  Gastrointestinal: Negative for abdominal distention.  Genitourinary: Negative for dysuria, urgency, frequency, hematuria, flank pain, decreased urine volume, difficulty urinating and dyspareunia.  Musculoskeletal: left axillary wound covered with dressing , no purulence or drainage , arthralgias and gait problem.  Neurological: Negative for dizziness, tremors, seizures, syncope, facial asymmetry, speech difficulty, weakness, light-headedness, numbness and headaches.  Hematological: Negative for adenopathy. Does not bruise/bleed easily.  Psychiatric/Behavioral: Negative for hallucinations, behavioral problems, confusion,  dysphoric mood, decreased concentration and agitation.    Objective:   Filed Vitals:   03/16/14 1111  BP: 127/80  Pulse: 73  Temp: 97.9 F (36.6 C)  Resp: 16    Physical Exam  Constitutional: Appears well-developed and well-nourished. No distress.  HENT: Normocephalic. External right and left ear normal. Oropharynx is clear and moist.  Eyes: Conjunctivae and EOM are normal. PERRLA, no scleral icterus.  Neck: Normal ROM. Neck supple. No JVD. No tracheal deviation. No thyromegaly.  CVS: RRR, S1/S2 +, no murmurs, no gallops, no carotid bruit.  Pulmonary: Effort and breath sounds normal, no stridor, rhonchi, wheezes, rales.  Abdominal: Soft. BS +,  no distension, tenderness, rebound or guarding.  Musculoskeletal: Normal range of motion. No edema and no tenderness.  Lymphadenopathy: No lymphadenopathy noted, cervical, inguinal. Neuro: Alert. Normal reflexes, muscle tone coordination. No cranial nerve deficit. Skin: Skin is warm and dry. No rash noted. Not diaphoretic. No erythema. No pallor.  Psychiatric: Normal mood and affect. Behavior, judgment, thought content normal.   Lab Results  Component Value Date   WBC 7.5 03/12/2014   HGB 10.8* 03/12/2014   HCT 31.9* 03/12/2014   MCV 102.6* 03/12/2014   PLT 359 03/12/2014   Lab Results  Component Value Date   CREATININE 0.65 03/08/2014   BUN 8 03/08/2014   NA 134* 03/08/2014   K 3.8 03/08/2014   CL 98 03/08/2014   CO2 24 03/08/2014    No results found for this basename: HGBA1C   Lipid Panel  No results found for this basename: chol, trig, hdl, cholhdl, vldl, ldlcalc       Assessment and plan:   Patient Active Problem List   Diagnosis Date Noted  . Cellulitis 03/07/2014   Left axillary abcess Stable continue doxycycline for another 5 days for a total of approx 2 weeks Patient instructed to return for worsening purulence and drainage, fever  Anxiety  Refill hydroxyzine  Pain control Will give 30 more tabs of norco , but no  further refills , that will be a total of 90 tabs she has received since 10/4  No PCP , so fu in 3 months for a full physical       The patient was given clear instructions to go to ER or return to medical center if symptoms don't improve, worsen or new problems develop. The patient verbalized understanding. The patient was told to call to get any lab results if not heard anything in the next week.

## 2014-03-16 NOTE — Patient Instructions (Addendum)
Patient    Can go back to work 10/13   should not lift anything heavier than 20 lbs for a month starting 10/7

## 2014-03-25 ENCOUNTER — Other Ambulatory Visit: Payer: Self-pay

## 2014-04-04 ENCOUNTER — Ambulatory Visit: Payer: Self-pay

## 2014-04-21 ENCOUNTER — Emergency Department (INDEPENDENT_AMBULATORY_CARE_PROVIDER_SITE_OTHER)
Admission: EM | Admit: 2014-04-21 | Discharge: 2014-04-21 | Disposition: A | Discharge status: Home / Self Care | Payer: Self-pay | Source: Home / Self Care | Attending: Family Medicine | Admitting: Family Medicine

## 2014-04-21 ENCOUNTER — Encounter (HOSPITAL_COMMUNITY): Payer: Self-pay | Admitting: *Deleted

## 2014-04-21 DIAGNOSIS — M47812 Spondylosis without myelopathy or radiculopathy, cervical region: Secondary | ICD-10-CM

## 2014-04-21 DIAGNOSIS — M4692 Unspecified inflammatory spondylopathy, cervical region: Secondary | ICD-10-CM

## 2014-04-21 DIAGNOSIS — M542 Cervicalgia: Secondary | ICD-10-CM

## 2014-04-21 DIAGNOSIS — M436 Torticollis: Secondary | ICD-10-CM

## 2014-04-21 MED ORDER — METHYLPREDNISOLONE SODIUM SUCC 125 MG IJ SOLR
80.0000 mg | Freq: Once | INTRAMUSCULAR | Status: AC
  Administered 2014-04-21: 80 mg via INTRAMUSCULAR

## 2014-04-21 MED ORDER — IBUPROFEN 800 MG PO TABS: 800.0000 mg | ORAL_TABLET | Freq: Three times a day (TID) | ORAL | Status: DC

## 2014-04-21 MED ORDER — METHYLPREDNISOLONE SODIUM SUCC 125 MG IJ SOLR
INTRAMUSCULAR | Status: AC
  Filled 2014-04-21: qty 2

## 2014-04-21 MED ORDER — TRAMADOL HCL 50 MG PO TABS: 50.0000 mg | ORAL_TABLET | Freq: Four times a day (QID) | ORAL | Status: DC | PRN

## 2014-04-21 MED ORDER — KETOROLAC TROMETHAMINE 60 MG/2ML IM SOLN
INTRAMUSCULAR | Status: AC
  Filled 2014-04-21: qty 2

## 2014-04-21 MED ORDER — PREDNISONE 10 MG PO TABS: ORAL_TABLET | ORAL | Status: DC

## 2014-04-21 MED ORDER — KETOROLAC TROMETHAMINE 60 MG/2ML IM SOLN
60.0000 mg | Freq: Once | INTRAMUSCULAR | Status: AC
  Administered 2014-04-21: 60 mg via INTRAMUSCULAR

## 2014-04-21 NOTE — ED Provider Notes (Signed)
CSN: [id number]     Arrival date & time 04/21/14  1610 History   None    Chief Complaint  Patient presents with  . Neck Pain   (Consider location/radiation/quality/duration/timing/severity/associated sxs/prior Treatment) Patient is a 43 y.o. female presenting with neck pain.  Neck Pain             43 year old female presents complaining of neck pain. 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 In the last few days this has flared up and gotten a lot worse. She has had this happen before and it felt exactly like she is feeling today. She has severe pain and stiffness in her neck and a burning sensation to the right side of her neck. Her skin is tingling and burning. The pain is so bad that it has caused her to vomit. She denies any headache, back pain, fever, chills, recent travel, sick contacts. No rashes    Past Medical History  Diagnosis Date  . Cervical cancer     Dr. Anastasia Pall   . Cellulitis of axilla, left 03/07/2014  . Hypertension     hx  . High triglycerides     hx  . Asthma   . Arthritis     "neck" (03/07/2014)  . Anxiety   . Depression   . Nephrolithiasis   . Chronic ear infection   . Fractured coccyx     x2   Past Surgical History  Procedure Laterality Date  . Tonsillectomy    . Dilation and evacuation  ~ 2008  . Dilation and curettage of uterus  1991    "miscarriage"  . Cervical cone biopsy  ~ 1999    just in lining, normal pap since   . Incision and drainage abscess Left 03/11/2014    Procedure: INCISION AND DRAINAGE ABSCESS;  Surgeon: Georganna Skeans, MD;  Location: [facility] OR;  Service: General;  Laterality: Left;   Family History  Problem Relation Age of Onset  . Depression Mother   . Heart disease Father     stent put in   History  Substance Use Topics  . Smoking status: Current Every Day Smoker -- 1.00 packs/day for 24 years    Types: Cigarettes  . Smokeless tobacco: Never Used  . Alcohol Use: 12.0 oz/week    10 Glasses of wine, 10 Cans  of beer per week   OB History    No data available     Review of Systems  Gastrointestinal: Positive for nausea and vomiting.  Musculoskeletal: Positive for neck pain and neck stiffness.  All other systems reviewed and are negative.   Allergies  Review of patient's allergies indicates no known allergies.  Home Medications   Prior to Admission medications   Medication Sig Start Date End Date Taking? Authorizing Provider  diphenhydrAMINE (BENADRYL) 25 MG tablet Take 25 mg by mouth every 6 (six) hours as needed for allergies.    Historical Provider, MD  doxycycline (VIBRA-TABS) 100 MG tablet Take 1 tablet (100 mg total) by mouth 2 (two) times daily. 03/16/14   Reyne Dumas, MD  HYDROcodone-acetaminophen (NORCO/VICODIN) 5-325 MG per tablet Take 1-2 tablets by mouth every 6 (six) hours. 03/16/14   Reyne Dumas, MD  hydrOXYzine (VISTARIL) 50 MG capsule Take 1 capsule (50 mg total) by mouth 3 (three) times daily as needed for anxiety. 03/16/14   Reyne Dumas, MD  ibuprofen (ADVIL,MOTRIN) 800 MG tablet Take 1 tablet (800 mg total) by mouth 3 (three) times daily. 04/21/14  Liam Graham, PA-C  predniSONE (DELTASONE) 10 MG tablet 4 tabs PO QD for 4 days; 3 tabs PO QD for 3 days; 2 tabs PO QD for 2 days; 1 tab PO QD for 1 day 04/21/14   Liam Graham, PA-C  senna-docusate (SENOKOT-S) 8.6-50 MG per tablet Take 1 tablet by mouth at bedtime as needed for mild constipation. 03/13/14   Olam Idler, MD  traMADol (ULTRAM) 50 MG tablet Take 1 tablet (50 mg total) by mouth every 6 (six) hours as needed. 04/21/14   Freeman Caldron Jude Linck, PA-C   BP 132/90 mmHg  Pulse 93  Temp(Src) 98.8 F (37.1 C) (Oral)  Resp 20  SpO2 99% Physical Exam  Constitutional: She is oriented to person, place, and time. Vital signs are normal. She appears well-developed and well-nourished. No distress.  HENT:  Head: Normocephalic and atraumatic.  Neck: Spinous process tenderness and muscular tenderness present. No rigidity.  Decreased range of motion present. No Brudzinski's sign and no Kernig's sign noted.  The neck is stiff, mostly in rotation, with slight decrease in flexion, but when she is able to relax she full passive range of motion. No meningismus.  Pulmonary/Chest: Effort normal. No respiratory distress.  Neurological: She is alert and oriented to person, place, and time. She has normal strength and normal reflexes. No cranial nerve deficit or sensory deficit. She exhibits normal muscle tone. She displays a negative Romberg sign. Coordination and gait normal. GCS eye subscore is 4. GCS verbal subscore is 5. GCS motor subscore is 6.  Skin: Skin is warm and dry. No rash noted. She is not diaphoretic.  Psychiatric: She has a normal mood and affect. Judgment normal.  Nursing note and vitals reviewed.   ED Course  Procedures (including critical care time) Labs Review Labs Reviewed - No data to display  Imaging Review No results found.   MDM   1. Neck arthritis   2. Neck pain   3. Neck stiffness    No concern for meningitis at this point as she states this is identical to previous flareups of her arthritis. Treat with prednisone, ibuprofen, and tramadol. Emergency department if worsening or she develops a fever   Meds ordered this encounter  Medications  . ketorolac (TORADOL) injection 60 mg    Sig:   . methylPREDNISolone sodium succinate (SOLU-MEDROL) 125 mg/2 mL injection 80 mg    Sig:   . predniSONE (DELTASONE) 10 MG tablet    Sig: 4 tabs PO QD for 4 days; 3 tabs PO QD for 3 days; 2 tabs PO QD for 2 days; 1 tab PO QD for 1 day    Dispense:  30 tablet    Refill:  0    Order Specific Question:  Supervising Provider    Answer:  Billy Fischer [phone]  . traMADol (ULTRAM) 50 MG tablet    Sig: Take 1 tablet (50 mg total) by mouth every 6 (six) hours as needed.    Dispense:  15 tablet    Refill:  0    Order Specific Question:  Supervising Provider    Answer:  Billy Fischer [phone]  .  ibuprofen (ADVIL,MOTRIN) 800 MG tablet    Sig: Take 1 tablet (800 mg total) by mouth 3 (three) times daily.    Dispense:  60 tablet    Refill:  0    Order Specific Question:  Supervising Provider    Answer:  Ihor Gully D [phone]  Liam Graham, PA-C 04/21/14 [phone]

## 2014-04-21 NOTE — ED Notes (Signed)
Pt     Reports    Neck pain      Chronic      Had  A  Flair           Up    Yesterday     She  Reports    Pain  Not  releived  By OTC   remedys          Pt  denys any specefic injury

## 2014-04-21 NOTE — Discharge Instructions (Signed)

## 2014-08-24 ENCOUNTER — Encounter (HOSPITAL_COMMUNITY): Payer: Self-pay

## 2014-08-24 ENCOUNTER — Emergency Department (INDEPENDENT_AMBULATORY_CARE_PROVIDER_SITE_OTHER)
Admission: EM | Admit: 2014-08-24 | Discharge: 2014-08-24 | Disposition: A | Discharge status: Home / Self Care | Payer: Self-pay | Source: Home / Self Care | Attending: Family Medicine | Admitting: Family Medicine

## 2014-08-24 DIAGNOSIS — M5030 Other cervical disc degeneration, unspecified cervical region: Secondary | ICD-10-CM

## 2014-08-24 MED ORDER — TRIAMCINOLONE ACETONIDE 40 MG/ML IJ SUSP
INTRAMUSCULAR | Status: AC
  Filled 2014-08-24: qty 1

## 2014-08-24 MED ORDER — CYCLOBENZAPRINE HCL 5 MG PO TABS: 5.0000 mg | ORAL_TABLET | Freq: Three times a day (TID) | ORAL | Status: DC | PRN

## 2014-08-24 MED ORDER — METHYLPREDNISOLONE 4 MG PO KIT: PACK | ORAL | Status: DC

## 2014-08-24 MED ORDER — KETOROLAC TROMETHAMINE 30 MG/ML IJ SOLN: 30.0000 mg | Freq: Once | INTRAMUSCULAR | Status: DC

## 2014-08-24 MED ORDER — KETOROLAC TROMETHAMINE 30 MG/ML IJ SOLN
INTRAMUSCULAR | Status: AC
  Filled 2014-08-24: qty 1

## 2014-08-24 MED ORDER — TRIAMCINOLONE ACETONIDE 40 MG/ML IJ SUSP: 40.0000 mg | Freq: Once | INTRAMUSCULAR | Status: DC

## 2014-08-24 NOTE — ED Provider Notes (Signed)
CSN: [id number]     Arrival date & time 08/24/14  1831 History   First MD Initiated Contact with Patient 08/24/14 1945     Chief Complaint  Patient presents with  . Neck Pain   (Consider location/radiation/quality/duration/timing/severity/associated sxs/prior Treatment) Patient is a 44 y.o. female presenting with neck injury. The history is provided by the patient.  Neck Injury This is a chronic problem. The current episode started 2 days ago. The problem has been gradually worsening. Pertinent negatives include no chest pain, no abdominal pain and no headaches. Associated symptoms comments: Chronic deg changes, seen 11/15. Recurrent problem from husband amputee getting out of hosp. No neuro sx..    Past Medical History  Diagnosis Date  . Cervical cancer     Dr. Anastasia Pall   . Cellulitis of axilla, left 03/07/2014  . Hypertension     hx  . High triglycerides     hx  . Asthma   . Arthritis     "neck" (03/07/2014)  . Anxiety   . Depression   . Nephrolithiasis   . Chronic ear infection   . Fractured coccyx     x2   Past Surgical History  Procedure Laterality Date  . Tonsillectomy    . Dilation and evacuation  ~ 2008  . Dilation and curettage of uterus  1991    "miscarriage"  . Cervical cone biopsy  ~ 1999    just in lining, normal pap since   . Incision and drainage abscess Left 03/11/2014    Procedure: INCISION AND DRAINAGE ABSCESS;  Surgeon: Georganna Skeans, MD;  Location: [facility] OR;  Service: General;  Laterality: Left;   Family History  Problem Relation Age of Onset  . Depression Mother   . Heart disease Father     stent put in   History  Substance Use Topics  . Smoking status: Current Every Day Smoker -- 1.00 packs/day for 24 years    Types: Cigarettes  . Smokeless tobacco: Never Used  . Alcohol Use: 12.0 oz/week    10 Glasses of wine, 10 Cans of beer per week   OB History    No data available     Review of Systems  Constitutional: Negative.    Cardiovascular: Negative for chest pain.  Gastrointestinal: Negative for abdominal pain.  Musculoskeletal: Positive for neck pain and neck stiffness.  Skin: Negative.   Neurological: Negative for weakness, numbness and headaches.    Allergies  Review of patient's allergies indicates no known allergies.  Home Medications   Prior to Admission medications   Medication Sig Start Date End Date Taking? Authorizing Provider  cyclobenzaprine (FLEXERIL) 5 MG tablet Take 1 tablet (5 mg total) by mouth 3 (three) times daily as needed for muscle spasms. 08/24/14   Billy Fischer, MD  diphenhydrAMINE (BENADRYL) 25 MG tablet Take 25 mg by mouth every 6 (six) hours as needed for allergies.    Historical Provider, MD  doxycycline (VIBRA-TABS) 100 MG tablet Take 1 tablet (100 mg total) by mouth 2 (two) times daily. 03/16/14   Reyne Dumas, MD  HYDROcodone-acetaminophen (NORCO/VICODIN) 5-325 MG per tablet Take 1-2 tablets by mouth every 6 (six) hours. 03/16/14   Reyne Dumas, MD  hydrOXYzine (VISTARIL) 50 MG capsule Take 1 capsule (50 mg total) by mouth 3 (three) times daily as needed for anxiety. 03/16/14   Reyne Dumas, MD  ibuprofen (ADVIL,MOTRIN) 800 MG tablet Take 1 tablet (800 mg total) by mouth 3 (three) times daily. 04/21/14   Alroy Dust  H Baker, PA-C  methylPREDNISolone (MEDROL DOSEPAK) 4 MG tablet follow package directions, start on thurs, take until finished. 08/24/14   Billy Fischer, MD  predniSONE (DELTASONE) 10 MG tablet 4 tabs PO QD for 4 days; 3 tabs PO QD for 3 days; 2 tabs PO QD for 2 days; 1 tab PO QD for 1 day 04/21/14   Liam Graham, PA-C  senna-docusate (SENOKOT-S) 8.6-50 MG per tablet Take 1 tablet by mouth at bedtime as needed for mild constipation. 03/13/14   Olam Idler, MD  traMADol (ULTRAM) 50 MG tablet Take 1 tablet (50 mg total) by mouth every 6 (six) hours as needed. 04/21/14   Freeman Caldron Baker, PA-C   BP 146/84 mmHg  Pulse 89  Temp(Src) 98.4 F (36.9 C) (Oral)  Resp 16  SpO2  100%  LMP 08/03/2014 (Exact Date) Physical Exam  Constitutional: She is oriented to person, place, and time. She appears well-developed and well-nourished. No distress.  HENT:  Right Ear: External ear normal.  Left Ear: External ear normal.  Mouth/Throat: Oropharynx is clear and moist.  Neck: Muscular tenderness present. No spinous process tenderness present. No rigidity. Decreased range of motion present.  Musculoskeletal:  Nl grip, gross motor and sens fxn. Upper and lower ext.  Lymphadenopathy:    She has no cervical adenopathy.  Neurological: She is alert and oriented to person, place, and time.  Skin: Skin is warm and dry.  Nursing note and vitals reviewed.   ED Course  Procedures (including critical care time) Labs Review Labs Reviewed - No data to display  Imaging Review No results found.   MDM   1. Degenerative disc disease, cervical        Billy Fischer, MD 08/24/14 2008

## 2014-08-24 NOTE — ED Notes (Signed)
Neck pain

## 2014-08-24 NOTE — Discharge Instructions (Signed)
Heat to neck , use medicine as needed, see orthopedist for further problems and care.

## 2015-01-11 LAB — PROCEDURE REPORT - SCANNED: PAP SMEAR: NEGATIVE

## 2016-03-06 ENCOUNTER — Encounter: Payer: Self-pay | Admitting: *Deleted

## 2016-04-22 ENCOUNTER — Encounter (HOSPITAL_COMMUNITY): Payer: Self-pay | Admitting: Emergency Medicine

## 2016-04-22 ENCOUNTER — Ambulatory Visit (HOSPITAL_COMMUNITY)
Admission: EM | Admit: 2016-04-22 | Discharge: 2016-04-22 | Disposition: A | Discharge status: Home / Self Care | Payer: Self-pay | Attending: Emergency Medicine | Admitting: Emergency Medicine

## 2016-04-22 DIAGNOSIS — A09 Infectious gastroenteritis and colitis, unspecified: Secondary | ICD-10-CM

## 2016-04-22 DIAGNOSIS — R197 Diarrhea, unspecified: Secondary | ICD-10-CM

## 2016-04-22 MED ORDER — PRAZIQUANTEL 600 MG PO TABS: 600.0000 mg | ORAL_TABLET | Freq: Once | ORAL | 0 refills | Status: AC

## 2016-04-22 NOTE — Discharge Instructions (Signed)
I suspect you got an intestinal tapeworm from your dog. Take praziquantel 1 tablet once. Make sure you get your dog treated for fleas. If this medicine does not resolve your symptoms, please come back so we can collect a stool sample.

## 2016-04-22 NOTE — ED Provider Notes (Signed)
[place]    CSN: [id number] Arrival date & time: 04/22/16  1501     History   Chief Complaint Chief Complaint  Patient presents with  . Emesis  . Diarrhea    HPI Whitney Murphy is a 45 y.o. female.   HPI Patient is a 45 y.o. female who presents for 1 week of nausea, vomiting, and diarrhea with worms seen in both her diarrhea and vomit. States that she has been vomiting about 1-3x per day and that it is usually clear.  Three days ago she vomited onto a blanket and noticed a very short worm in the vomit. She was hoping that the worm had come from somewhere else. Yesterday, she noticed at least 3 more in her stool. Also notes mucus discharge from anus x 1 week.  States that she had raw tuna at a [employer] about a week ago.  Does have a dog at home who has been itching, not up to date on flea treatment. Denies recent travel. Denies any fever, chills, abdominal pain, blood in stool.   Past Medical History:  Diagnosis Date  . Anxiety   . Arthritis    "neck" (03/07/2014)  . Asthma   . Cellulitis of axilla, left 03/07/2014  . Cervical cancer ([place])    Dr. Anastasia Pall   . Chronic ear infection   . Depression   . Fractured coccyx ([place])    x2  . High triglycerides    hx  . Hypertension    hx  . Nephrolithiasis     Patient Active Problem List   Diagnosis Date Noted  . Cellulitis 03/07/2014    Past Surgical History:  Procedure Laterality Date  . CERVICAL CONE BIOPSY  ~ 1999   just in lining, normal pap since   . DILATION AND CURETTAGE OF UTERUS  1991   "miscarriage"  . DILATION AND EVACUATION  ~ 2008  . INCISION AND DRAINAGE ABSCESS Left 03/11/2014   Procedure: INCISION AND DRAINAGE ABSCESS;  Surgeon: Georganna Skeans, MD;  Location: [facility];  Service: General;  Laterality: Left;  . TONSILLECTOMY      OB History    No data available       Home Medications    Prior to Admission medications   Medication Sig Start Date End Date Taking?  Authorizing Provider  cyclobenzaprine (FLEXERIL) 5 MG tablet Take 1 tablet (5 mg total) by mouth 3 (three) times daily as needed for muscle spasms. 08/24/14   Billy Fischer, MD  diphenhydrAMINE (BENADRYL) 25 MG tablet Take 25 mg by mouth every 6 (six) hours as needed for allergies.    Historical Provider, MD  HYDROcodone-acetaminophen (NORCO/VICODIN) 5-325 MG per tablet Take 1-2 tablets by mouth every 6 (six) hours. 03/16/14   Reyne Dumas, MD  hydrOXYzine (VISTARIL) 50 MG capsule Take 1 capsule (50 mg total) by mouth 3 (three) times daily as needed for anxiety. 03/16/14   Reyne Dumas, MD  ibuprofen (ADVIL,MOTRIN) 800 MG tablet Take 1 tablet (800 mg total) by mouth 3 (three) times daily. 04/21/14   Freeman Caldron Baker, PA-C  methylPREDNISolone (MEDROL DOSEPAK) 4 MG tablet follow package directions, start on thurs, take until finished. Patient not taking: Reported on 04/22/2016 08/24/14   Billy Fischer, MD  praziquantel (BILTRICIDE) 600 MG tablet Take 1 tablet (600 mg total) by mouth once. 04/22/16 04/22/16  Melony Overly, MD  predniSONE (DELTASONE) 10 MG tablet 4 tabs PO QD for 4 days; 3 tabs PO QD  for 3 days; 2 tabs PO QD for 2 days; 1 tab PO QD for 1 day Patient not taking: Reported on 04/22/2016 04/21/14   Liam Graham, PA-C  senna-docusate (SENOKOT-S) 8.6-50 MG per tablet Take 1 tablet by mouth at bedtime as needed for mild constipation. Patient not taking: Reported on 04/22/2016 03/13/14   Olam Idler, MD  traMADol (ULTRAM) 50 MG tablet Take 1 tablet (50 mg total) by mouth every 6 (six) hours as needed. Patient not taking: Reported on 04/22/2016 04/21/14   Liam Graham, PA-C    Family History Family History  Problem Relation Age of Onset  . Depression Mother   . Heart disease Father     stent put in    Social History Social History  Substance Use Topics  . Smoking status: Current Every Day Smoker    Packs/day: 1.00    Years: 24.00    Types: Cigarettes  . Smokeless tobacco: Never  Used  . Alcohol use 12.0 oz/week    10 Glasses of wine, 10 Cans of beer per week     Allergies   Patient has no known allergies.   Review of Systems Review of Systems  Constitutional: Negative for activity change, chills, fatigue and fever.  Gastrointestinal: Positive for diarrhea, nausea and vomiting. Negative for abdominal pain, anal bleeding, blood in stool, constipation and rectal pain.       Mucus in anus x 1 week  Genitourinary: Negative for hematuria.  All other systems reviewed and are negative.    Physical Exam Triage Vital Signs ED Triage Vitals [04/22/16 1611]  Enc Vitals Group     BP 139/89     Pulse Rate 81     Resp 18     Temp 98.8 F (37.1 C)     Temp Source Oral     SpO2 98 %     Weight      Height      Head Circumference      Peak Flow      Pain Score      Pain Loc      Pain Edu?      Excl. in [place]?    No data found.   Updated Vital Signs BP 139/89 (BP Location: Left Arm)   Pulse 81   Temp 98.8 F (37.1 C) (Oral)   Resp 18   LMP 03/22/2016   SpO2 98%   Physical Exam  Constitutional: She is oriented to person, place, and time. She appears well-developed and well-nourished. No distress.  HENT:  Head: Normocephalic and atraumatic.  Eyes: EOM are normal.  Neck: Normal range of motion. Neck supple.  Cardiovascular: Normal rate and regular rhythm.   Pulmonary/Chest: Effort normal and breath sounds normal.  Abdominal: Soft. Bowel sounds are normal. She exhibits no distension and no mass. There is no tenderness. There is no rebound and no guarding.  Musculoskeletal: Normal range of motion.  Neurological: She is alert and oriented to person, place, and time.  Skin: Skin is warm and dry. She is not diaphoretic.  Psychiatric: She has a normal mood and affect. Her behavior is normal. Judgment and thought content normal.  Nursing note and vitals reviewed.    UC Treatments / Results  Labs (all labs ordered are listed, but only abnormal results  are displayed) Labs Reviewed - No data to display  EKG  EKG Interpretation None       Radiology No results found.  Procedures Procedures (including critical care  time)  Medications Ordered in UC Medications - No data to display   Initial Impression / Assessment and Plan / UC Course  I have reviewed the triage vital signs and the nursing notes.  Pertinent labs & imaging results that were available during my care of the patient were reviewed by me and considered in my medical decision making (see chart for details).  Clinical Course    45 y.o. female presents with nausea, vomiting, diarrhea, mucus from anus x 1 week and has recently found worms in both vomit and stool.   Suspect this is a tapeworm acquired from the patient's dog.   Patient states that she is unable to provide stool sample at this time.   Praziquantel 1 tablet once has been prescribed at today's visit.   Advised to return if symptoms do not resolve so that a stool sample can be collected.  Advised to take dog to the vet to get treated for fleas.  Patient seen and examined with PA student.  Note reviewed and edited as needed.  Final Clinical Impressions(s) / UC Diagnoses   Final diagnoses:  Diarrhea of presumed infectious origin    New Prescriptions New Prescriptions   PRAZIQUANTEL (BILTRICIDE) 600 MG TABLET    Take 1 tablet (600 mg total) by mouth once.     Melony Overly, MD 04/22/16 2127

## 2016-04-22 NOTE — ED Triage Notes (Signed)
Vomiting and diarrhea for a week.  Vomiting every day.  Random diarrhea.  Patient has had no appetite.  Patient reports finding worm in emesis and stool.

## 2017-03-13 ENCOUNTER — Emergency Department (HOSPITAL_COMMUNITY)
Admission: EM | Admit: 2017-03-13 | Discharge: 2017-03-13 | Disposition: A | Discharge status: Home / Self Care | Payer: Self-pay | Attending: Emergency Medicine | Admitting: Emergency Medicine

## 2017-03-13 ENCOUNTER — Emergency Department (HOSPITAL_COMMUNITY): Payer: Self-pay

## 2017-03-13 ENCOUNTER — Encounter (HOSPITAL_COMMUNITY): Payer: Self-pay | Admitting: Emergency Medicine

## 2017-03-13 DIAGNOSIS — R1010 Upper abdominal pain, unspecified: Secondary | ICD-10-CM | POA: Insufficient documentation

## 2017-03-13 DIAGNOSIS — I10 Essential (primary) hypertension: Secondary | ICD-10-CM | POA: Insufficient documentation

## 2017-03-13 DIAGNOSIS — R072 Precordial pain: Secondary | ICD-10-CM | POA: Insufficient documentation

## 2017-03-13 DIAGNOSIS — J45909 Unspecified asthma, uncomplicated: Secondary | ICD-10-CM | POA: Insufficient documentation

## 2017-03-13 DIAGNOSIS — Z8541 Personal history of malignant neoplasm of cervix uteri: Secondary | ICD-10-CM | POA: Insufficient documentation

## 2017-03-13 DIAGNOSIS — R7989 Other specified abnormal findings of blood chemistry: Secondary | ICD-10-CM

## 2017-03-13 DIAGNOSIS — Z79899 Other long term (current) drug therapy: Secondary | ICD-10-CM | POA: Insufficient documentation

## 2017-03-13 DIAGNOSIS — F17210 Nicotine dependence, cigarettes, uncomplicated: Secondary | ICD-10-CM | POA: Insufficient documentation

## 2017-03-13 DIAGNOSIS — R945 Abnormal results of liver function studies: Secondary | ICD-10-CM | POA: Insufficient documentation

## 2017-03-13 LAB — BASIC METABOLIC PANEL
ANION GAP: 15 (ref 5–15)
BUN: <5 mg/dL — ABNORMAL LOW (ref 6–20)
CALCIUM: 9.1 mg/dL (ref 8.9–10.3)
CO2: 21 mmol/L — AB (ref 22–32)
CREATININE: 0.52 mg/dL (ref 0.44–1.00)
Chloride: 102 mmol/L (ref 101–111)
GFR calc Af Amer: >60 mL/min (ref >60)
GLUCOSE: 97 mg/dL (ref 65–99)
Potassium: 3.8 mmol/L (ref 3.5–5.1)
Sodium: 138 mmol/L (ref 135–145)

## 2017-03-13 LAB — HEPATIC FUNCTION PANEL
ALT: 87 U/L — ABNORMAL HIGH (ref 14–54)
AST: 225 U/L — AB (ref 15–41)
Albumin: 4.1 g/dL (ref 3.5–5.0)
Alkaline Phosphatase: 78 U/L (ref 38–126)
BILIRUBIN DIRECT: 0.2 mg/dL (ref 0.1–0.5)
BILIRUBIN INDIRECT: 0.5 mg/dL (ref 0.3–0.9)
BILIRUBIN TOTAL: 0.7 mg/dL (ref 0.3–1.2)
Total Protein: 7.8 g/dL (ref 6.5–8.1)

## 2017-03-13 LAB — CBC
HCT: 39.5 % (ref 36.0–46.0)
HEMOGLOBIN: 14.1 g/dL (ref 12.0–15.0)
MCH: 36.3 pg — AB (ref 26.0–34.0)
MCHC: 35.7 g/dL (ref 30.0–36.0)
MCV: 101.8 fL — ABNORMAL HIGH (ref 78.0–100.0)
PLATELETS: 197 10*3/uL (ref 150–400)
RBC: 3.88 MIL/uL (ref 3.87–5.11)
RDW: 13.2 % (ref 11.5–15.5)
WBC: 5.4 10*3/uL (ref 4.0–10.5)

## 2017-03-13 LAB — I-STAT TROPONIN, ED: TROPONIN I, POC: 0 ng/mL (ref 0.00–0.08)

## 2017-03-13 LAB — LIPASE, BLOOD: Lipase: 84 U/L — ABNORMAL HIGH (ref 11–51)

## 2017-03-13 MED ORDER — ACETAMINOPHEN 500 MG PO TABS
1000.0000 mg | ORAL_TABLET | Freq: Once | ORAL | Status: AC
  Administered 2017-03-13: 1000 mg via ORAL
  Filled 2017-03-13: qty 2

## 2017-03-13 MED ORDER — IBUPROFEN 400 MG PO TABS
400.0000 mg | ORAL_TABLET | Freq: Once | ORAL | Status: AC
  Administered 2017-03-13: 400 mg via ORAL
  Filled 2017-03-13: qty 1

## 2017-03-13 NOTE — ED Provider Notes (Addendum)
[facility] DEPT Provider Note   CSN: [id number] Arrival date & time: 03/13/17  3235     History   Chief Complaint Chief Complaint  Patient presents with  . Chest Pain    HPI Whitney Murphy is a 46 y.o. female.  Patient c/o mid chest pain in past 24 hours. Pain constant, dull, moderate.  Pain occurs at rest. No relation to activity or exertion. Denies associated nv, diaphoresis or sob. No unusual doe or fatigue. Pain is not pleuritic. Occasional non prod cough. +smoker. No fever or chills. Denies chest wall injury or strain. No heartburn. No abd, back or flank pain. Denies leg pain or swelling. No recent immobility, surgery, trauma, travel. Denies hx dvt or pe. No fam hx premature cad. No palpitations.    The history is provided by the patient.  Chest Pain   Pertinent negatives include no abdominal pain, no back pain, no fever, no headaches and no shortness of breath.    Past Medical History:  Diagnosis Date  . Anxiety   . Arthritis    "neck" (03/07/2014)  . Asthma   . Cellulitis of axilla, left 03/07/2014  . Cervical cancer ([place])    Dr. Anastasia Pall   . Chronic ear infection   . Depression   . Fractured coccyx ([place])    x2  . High triglycerides    hx  . Hypertension    hx  . Nephrolithiasis     Patient Active Problem List   Diagnosis Date Noted  . Cellulitis 03/07/2014    Past Surgical History:  Procedure Laterality Date  . CERVICAL CONE BIOPSY  ~ 1999   just in lining, normal pap since   . DILATION AND CURETTAGE OF UTERUS  1991   "miscarriage"  . DILATION AND EVACUATION  ~ 2008  . INCISION AND DRAINAGE ABSCESS Left 03/11/2014   Procedure: INCISION AND DRAINAGE ABSCESS;  Surgeon: Georganna Skeans, MD;  Location: [facility];  Service: General;  Laterality: Left;  . TONSILLECTOMY      OB History    No data available       Home Medications    Prior to Admission medications   Medication Sig Start Date End Date Taking? Authorizing Provider    cyclobenzaprine (FLEXERIL) 5 MG tablet Take 1 tablet (5 mg total) by mouth 3 (three) times daily as needed for muscle spasms. 08/24/14   Billy Fischer, MD  diphenhydrAMINE (BENADRYL) 25 MG tablet Take 25 mg by mouth every 6 (six) hours as needed for allergies.    [provider]  HYDROcodone-acetaminophen (NORCO/VICODIN) 5-325 MG per tablet Take 1-2 tablets by mouth every 6 (six) hours. 03/16/14   Reyne Dumas, MD  hydrOXYzine (VISTARIL) 50 MG capsule Take 1 capsule (50 mg total) by mouth 3 (three) times daily as needed for anxiety. 03/16/14   Reyne Dumas, MD  ibuprofen (ADVIL,MOTRIN) 800 MG tablet Take 1 tablet (800 mg total) by mouth 3 (three) times daily. 04/21/14   Baker, Freeman Caldron, PA-C  methylPREDNISolone (MEDROL DOSEPAK) 4 MG tablet follow package directions, start on thurs, take until finished. Patient not taking: Reported on 04/22/2016 08/24/14   Billy Fischer, MD  predniSONE (DELTASONE) 10 MG tablet 4 tabs PO QD for 4 days; 3 tabs PO QD for 3 days; 2 tabs PO QD for 2 days; 1 tab PO QD for 1 day Patient not taking: Reported on 04/22/2016 04/21/14   Liam Graham, PA-C  senna-docusate (SENOKOT-S) 8.6-50 MG per tablet Take 1 tablet  by mouth at bedtime as needed for mild constipation. Patient not taking: Reported on 04/22/2016 03/13/14   Olam Idler, MD  traMADol (ULTRAM) 50 MG tablet Take 1 tablet (50 mg total) by mouth every 6 (six) hours as needed. Patient not taking: Reported on 04/22/2016 04/21/14   Liam Graham, PA-C    Family History Family History  Problem Relation Age of Onset  . Depression Mother   . Heart disease Father        stent put in    Social History Social History  Substance Use Topics  . Smoking status: Current Every Day Smoker    Packs/day: 1.00    Years: 24.00    Types: Cigarettes  . Smokeless tobacco: Never Used  . Alcohol use 12.0 oz/week    10 Glasses of wine, 10 Cans of beer per week     Allergies   Patient has no known  allergies.   Review of Systems Review of Systems  Constitutional: Negative for fever.  HENT: Negative for sore throat.   Eyes: Negative for redness.  Respiratory: Negative for shortness of breath.   Cardiovascular: Positive for chest pain. Negative for leg swelling.  Gastrointestinal: Negative for abdominal pain.  Genitourinary: Negative for flank pain.  Musculoskeletal: Negative for back pain and neck pain.  Skin: Negative for rash.  Neurological: Negative for headaches.  Hematological: Does not bruise/bleed easily.  Psychiatric/Behavioral: Negative for confusion.     Physical Exam Updated Vital Signs BP (!) 122/98   Pulse 93   Temp 99.1 F (37.3 C) (Oral)   Resp 18   SpO2 100%   Physical Exam  Constitutional: She appears well-developed and well-nourished. No distress.  HENT:  Head: Atraumatic.  Eyes: Conjunctivae are normal. No scleral icterus.  Neck: Neck supple. No tracheal deviation present.  Cardiovascular: Normal rate, regular rhythm, normal heart sounds and intact distal pulses.  Exam reveals no gallop and no friction rub.   No murmur heard. Pulmonary/Chest: Effort normal and breath sounds normal. No respiratory distress. She exhibits no tenderness.  Abdominal: Soft. Normal appearance. She exhibits no distension. There is no tenderness.  Musculoskeletal: She exhibits no edema or tenderness.  Neurological: She is alert.  Skin: Skin is warm and dry. No rash noted. She is not diaphoretic.  Psychiatric: She has a normal mood and affect.  Nursing note and vitals reviewed.    ED Treatments / Results  Labs (all labs ordered are listed, but only abnormal results are displayed) Results for orders placed or performed during the hospital encounter of 78/29/56  Basic metabolic panel  Result Value Ref Range   Sodium 138 135 - 145 mmol/L   Potassium 3.8 3.5 - 5.1 mmol/L   Chloride 102 101 - 111 mmol/L   CO2 21 (L) 22 - 32 mmol/L   Glucose, Bld 97 65 - 99 mg/dL    BUN <5 (L) 6 - 20 mg/dL   Creatinine, Ser 0.52 0.44 - 1.00 mg/dL   Calcium 9.1 8.9 - 10.3 mg/dL   GFR calc non Af Amer >60 >60 mL/min   GFR calc Af Amer >60 >60 mL/min   Anion gap 15 5 - 15  CBC  Result Value Ref Range   WBC 5.4 4.0 - 10.5 K/uL   RBC 3.88 3.87 - 5.11 MIL/uL   Hemoglobin 14.1 12.0 - 15.0 g/dL   HCT 39.5 36.0 - 46.0 %   MCV 101.8 (H) 78.0 - 100.0 fL   MCH 36.3 (H) 26.0 - 34.0  pg   MCHC 35.7 30.0 - 36.0 g/dL   RDW 13.2 11.5 - 15.5 %   Platelets 197 150 - 400 K/uL  Hepatic function panel  Result Value Ref Range   Total Protein 7.8 6.5 - 8.1 g/dL   Albumin 4.1 3.5 - 5.0 g/dL   AST 225 (H) 15 - 41 U/L   ALT 87 (H) 14 - 54 U/L   Alkaline Phosphatase 78 38 - 126 U/L   Total Bilirubin 0.7 0.3 - 1.2 mg/dL   Bilirubin, Direct 0.2 0.1 - 0.5 mg/dL   Indirect Bilirubin 0.5 0.3 - 0.9 mg/dL  Lipase, blood  Result Value Ref Range   Lipase 84 (H) 11 - 51 U/L  I-stat troponin, ED  Result Value Ref Range   Troponin i, poc 0.00 0.00 - 0.08 ng/mL   Comment 3           Dg Chest 2 View  Result Date: 03/13/2017 CLINICAL DATA:  Right-sided chest pain EXAM: CHEST  2 VIEW COMPARISON:  02/03/2011 FINDINGS: The heart size and mediastinal contours are within normal limits. Both lungs are clear. The visualized skeletal structures are unremarkable. IMPRESSION: No active cardiopulmonary disease. Electronically Signed   By: Lucienne Capers M.D.   On: 03/13/2017 04:18    EKG  EKG Interpretation  Date/Time:  Thursday March 13 2017 03:34:24 EDT Ventricular Rate:  94 PR Interval:  108 QRS Duration: 80 QT Interval:  390 QTC Calculation: 487 R Axis:   89 Text Interpretation:  Sinus rhythm with short PR Possible Anterior infarct , age undetermined Abnormal ECG No old tracing to compare Confirmed by Delora Fuel [id number]) on 03/13/2017 5:21:10 AM Also confirmed by Delora Fuel ([id number]), editor Hattie Perch [phone])  on 03/13/2017 6:44:26 AM       Radiology Dg Chest 2 View  Result  Date: 03/13/2017 CLINICAL DATA:  Right-sided chest pain EXAM: CHEST  2 VIEW COMPARISON:  02/03/2011 FINDINGS: The heart size and mediastinal contours are within normal limits. Both lungs are clear. The visualized skeletal structures are unremarkable. IMPRESSION: No active cardiopulmonary disease. Electronically Signed   By: Lucienne Capers M.D.   On: 03/13/2017 04:18   US Abdomen Complete  Result Date: 03/13/2017 CLINICAL DATA:  Upper abdominal pain and elevated LFTs EXAM: ABDOMEN ULTRASOUND COMPLETE COMPARISON:  None. FINDINGS: Gallbladder: No gallstones or wall thickening visualized. No sonographic Murphy sign noted by sonographer. Common bile duct: Diameter: 3 mm. Liver: Mildly heterogeneous and increased in echogenicity consistent with fatty infiltration. No focal mass lesion is noted. Portal vein is patent on color Doppler imaging with normal direction of blood flow towards the liver. IVC: No abnormality visualized. Pancreas: Visualized portion unremarkable. Spleen: Size and appearance within normal limits. Right Kidney: Length: 10.8 cm. Echogenicity within normal limits. No mass or hydronephrosis visualized. Left Kidney: Length: 10.6 cm. Echogenicity within normal limits. No mass or hydronephrosis visualized. Abdominal aorta: No aneurysm visualized. Other findings: None. IMPRESSION: Fatty liver. No other focal abnormality is noted. Electronically Signed   By: Inez Catalina M.D.   On: 03/13/2017 11:28    Procedures Procedures (including critical care time)  Medications Ordered in ED Medications  ibuprofen (ADVIL,MOTRIN) tablet 400 mg (not administered)  acetaminophen (TYLENOL) tablet 1,000 mg (not administered)     Initial Impression / Assessment and Plan / ED Course  I have reviewed the triage vital signs and the nursing notes.  Pertinent labs & imaging results that were available during my care of the patient were reviewed by me  and considered in my medical decision making (see chart for  details).  Labs sent. Ecg.  Cxr.    After symptoms for past day, trop neg/0 - symptoms do not appear c/w ACS.  Reviewed nursing notes and prior charts for additional history.   Motrin po, acetaminophen po.  Discussed lft/lipase w pt.  Pt indicates drinks approx 6 pack per day. U/s neg acute. abd on recheck soft non tender. Will have f/u pcp.  Patient currently appears stable for d/c.     Final Clinical Impressions(s) / ED Diagnoses   Final diagnoses:  None    New Prescriptions New Prescriptions   No medications on file         Lajean Saver, MD 03/13/17 1211

## 2017-03-13 NOTE — ED Notes (Signed)
Patient states that she has been having chest pain. When asked to localize pain she is pointing to the epigastric/abdominal area. Pt admits to everyday etoh. She denies any withdrawal s/s at present. Breath sounds are clear and bowel sounds are present. Iv started and protocols started. Pt states that she has had a few episodes of nausea and vomiting.

## 2017-03-13 NOTE — ED Notes (Signed)
Got patient undress on the monitor patient is resting with family at bedside and call bell in reach 

## 2017-03-13 NOTE — ED Triage Notes (Signed)
Pt reports centralized chest pain moving to the right arm, that started about 3pm yesterday.  Pt stated she had one episode of emesis, dizziness and SOB. Pt states this "years ago"

## 2017-03-13 NOTE — ED Notes (Signed)
Pt transported to [facility]

## 2017-03-13 NOTE — Discharge Instructions (Signed)
It was our pleasure to provide your ER care today - we hope that you feel better.  Your heart tests look good.   Your ultrasound was read as being normal except for noting fatty appearance to liver.   From today's labs, a couple of your liver function tests are elevated (AST 225, ALT 87) and pancreas test mildly elevated (lipase 84) - limit alcohol use, and follow up with your primary care doctor, discuss results with them.  For chest discomfort, follow up with cardiologist - see referral - call office to arrange appointment.  Return to ER if worse, new symptoms, new or severe pain, trouble breathing, other concern.

## 2017-11-11 ENCOUNTER — Emergency Department (HOSPITAL_COMMUNITY): Payer: Self-pay

## 2017-11-11 ENCOUNTER — Other Ambulatory Visit: Payer: Self-pay

## 2017-11-11 ENCOUNTER — Encounter (HOSPITAL_COMMUNITY): Payer: Self-pay | Admitting: Emergency Medicine

## 2017-11-11 ENCOUNTER — Emergency Department (HOSPITAL_COMMUNITY)
Admission: EM | Admit: 2017-11-11 | Discharge: 2017-11-11 | Disposition: A | Discharge status: Home / Self Care | Payer: Self-pay | Attending: Emergency Medicine | Admitting: Emergency Medicine

## 2017-11-11 DIAGNOSIS — Z8541 Personal history of malignant neoplasm of cervix uteri: Secondary | ICD-10-CM | POA: Insufficient documentation

## 2017-11-11 DIAGNOSIS — R0789 Other chest pain: Secondary | ICD-10-CM | POA: Insufficient documentation

## 2017-11-11 DIAGNOSIS — J45909 Unspecified asthma, uncomplicated: Secondary | ICD-10-CM | POA: Insufficient documentation

## 2017-11-11 DIAGNOSIS — I10 Essential (primary) hypertension: Secondary | ICD-10-CM | POA: Insufficient documentation

## 2017-11-11 DIAGNOSIS — F17210 Nicotine dependence, cigarettes, uncomplicated: Secondary | ICD-10-CM | POA: Insufficient documentation

## 2017-11-11 DIAGNOSIS — Z79899 Other long term (current) drug therapy: Secondary | ICD-10-CM | POA: Insufficient documentation

## 2017-11-11 LAB — CBC
HCT: 41.7 % (ref 36.0–46.0)
HEMOGLOBIN: 14.3 g/dL (ref 12.0–15.0)
MCH: 35.7 pg — AB (ref 26.0–34.0)
MCHC: 34.3 g/dL (ref 30.0–36.0)
MCV: 104 fL — ABNORMAL HIGH (ref 78.0–100.0)
Platelets: 226 10*3/uL (ref 150–400)
RBC: 4.01 MIL/uL (ref 3.87–5.11)
RDW: 13.9 % (ref 11.5–15.5)
WBC: 10.3 10*3/uL (ref 4.0–10.5)

## 2017-11-11 LAB — I-STAT TROPONIN, ED
TROPONIN I, POC: 0 ng/mL (ref 0.00–0.08)
TROPONIN I, POC: 0 ng/mL (ref 0.00–0.08)

## 2017-11-11 LAB — BASIC METABOLIC PANEL
Anion gap: 11 (ref 5–15)
BUN: 6 mg/dL (ref 6–20)
CALCIUM: 8.9 mg/dL (ref 8.9–10.3)
CO2: 30 mmol/L (ref 22–32)
Chloride: 103 mmol/L (ref 101–111)
Creatinine, Ser: 0.53 mg/dL (ref 0.44–1.00)
GFR calc Af Amer: >60 mL/min (ref >60)
GFR calc non Af Amer: >60 mL/min (ref >60)
GLUCOSE: 115 mg/dL — AB (ref 65–99)
Potassium: 3.5 mmol/L (ref 3.5–5.1)
Sodium: 144 mmol/L (ref 135–145)

## 2017-11-11 LAB — D-DIMER, QUANTITATIVE: D-Dimer, Quant: 0.51 ug/mL-FEU — ABNORMAL HIGH (ref 0.00–0.50)

## 2017-11-11 LAB — I-STAT BETA HCG BLOOD, ED (MC, WL, AP ONLY): I-stat hCG, quantitative: <5 m[IU]/mL (ref <5)

## 2017-11-11 MED ORDER — GI COCKTAIL ~~LOC~~
30.0000 mL | Freq: Once | ORAL | Status: AC
  Administered 2017-11-11: 30 mL via ORAL
  Filled 2017-11-11: qty 30

## 2017-11-11 MED ORDER — PANTOPRAZOLE SODIUM 20 MG PO TBEC: 20.0000 mg | DELAYED_RELEASE_TABLET | Freq: Every day | ORAL | 0 refills | Status: DC

## 2017-11-11 MED ORDER — IOPAMIDOL (ISOVUE-370) INJECTION 76%
100.0000 mL | Freq: Once | INTRAVENOUS | Status: AC | PRN
  Administered 2017-11-11: 100 mL via INTRAVENOUS

## 2017-11-11 MED ORDER — IOPAMIDOL (ISOVUE-370) INJECTION 76%
INTRAVENOUS | Status: AC
  Filled 2017-11-11: qty 100

## 2017-11-11 MED ORDER — PANTOPRAZOLE SODIUM 40 MG PO TBEC
40.0000 mg | DELAYED_RELEASE_TABLET | Freq: Once | ORAL | Status: AC
  Administered 2017-11-11: 40 mg via ORAL
  Filled 2017-11-11: qty 1

## 2017-11-11 NOTE — Discharge Instructions (Addendum)
Make sure you are staying well-hydrated with water. Use Tylenol for pain. Take Protonix daily. Follow-up with your primary care doctor on Friday for reevaluation of your symptoms. Return to the emergency room if you develop any new or concerning symptoms.

## 2017-11-11 NOTE — ED Triage Notes (Addendum)
Cp that woke her up today now her jaw and back, neck  hurt has a bad cough ,hurts all over

## 2017-11-11 NOTE — ED Notes (Signed)
Family member came out of room and asked for food for themselves. Offered crackers / peanut butter and family member was upset that better food could not be provided as he stated "we have been here since 10:00am, you should have to feed [place]".

## 2017-11-11 NOTE — ED Provider Notes (Signed)
[facility] EMERGENCY DEPARTMENT Provider Note   CSN: [id number] Arrival date & time: 11/11/17  1200     History   Chief Complaint Chief Complaint  Patient presents with  . Chest Pain  . Back Pain    HPI Whitney Murphy is a 47 y.o. female presenting for evaluation of chest pressure.  Patient states she developed central chest pressure this morning when she woke up.  It is constant, laying flat makes it worse, as does inspiration.  Nothing makes it better.  It does not radiate.  She has history of similar episodes of chest pain, which has had negative work-ups in the past.  She denies associated shortness of breath, nausea, vomiting, or diaphoresis.  She denies history of ACS.  She has not taken anything for her pain.  She had a long plane flight a month ago, in which she states she was on the plane for over 4 hours.  She denies recent surgery, trauma, immobilization, hormone use, or history of previous DVT/PE.  She denies recent fevers, chills, headache, cough, abdominal pain, urinary symptoms, abnormal bowel movements, leg pain or swelling.pmh of anxiety, depression, HTN. She states she had cervical cancer that was removed with a colposcopy without spreading. No active cancer.   HPI  Past Medical History:  Diagnosis Date  . Anxiety   . Arthritis    "neck" (03/07/2014)  . Asthma   . Cellulitis of axilla, left 03/07/2014  . Cervical cancer ([place])    Dr. Anastasia Pall   . Chronic ear infection   . Depression   . Fractured coccyx ([place])    x2  . High triglycerides    hx  . Hypertension    hx  . Nephrolithiasis     Patient Active Problem List   Diagnosis Date Noted  . Cellulitis 03/07/2014    Past Surgical History:  Procedure Laterality Date  . CERVICAL CONE BIOPSY  ~ 1999   just in lining, normal pap since   . DILATION AND CURETTAGE OF UTERUS  1991   "miscarriage"  . DILATION AND EVACUATION  ~ 2008  . INCISION AND DRAINAGE ABSCESS Left 03/11/2014   Procedure: INCISION AND DRAINAGE ABSCESS;  Surgeon: Georganna Skeans, MD;  Location: [facility];  Service: General;  Laterality: Left;  . TONSILLECTOMY       OB History   None      Home Medications    Prior to Admission medications   Medication Sig Start Date End Date Taking? Authorizing Provider  Cholecalciferol (VITAMIN D-3 PO) Take 1 capsule by mouth daily.   Yes [provider]  Multiple Vitamins-Minerals (CENTRUM WOMEN) TABS Take 1 tablet by mouth daily.   Yes [provider]  sertraline (ZOLOFT) 50 MG tablet Take 50 mg by mouth at bedtime.   Yes [provider]  traZODone (DESYREL) 100 MG tablet Take 100 mg by mouth at bedtime as needed for sleep.   Yes [provider]  UNKNOWN TO PATIENT Unnamed allergy tablet: Take 1 tablet by mouth once a day as needed for allergic symptoms   Yes [provider]  cyclobenzaprine (FLEXERIL) 5 MG tablet Take 1 tablet (5 mg total) by mouth 3 (three) times daily as needed for muscle spasms. Patient not taking: Reported on 11/11/2017 08/24/14   Billy Fischer, MD  HYDROcodone-acetaminophen (NORCO/VICODIN) 5-325 MG per tablet Take 1-2 tablets by mouth every 6 (six) hours. Patient not taking: Reported on 11/11/2017 03/16/14   Reyne Dumas, MD  hydrOXYzine (VISTARIL) 50 MG capsule Take 1 capsule (50 mg total) by mouth 3 (three) times daily as needed for anxiety. Patient not taking: Reported on 11/11/2017 03/16/14   Reyne Dumas, MD  ibuprofen (ADVIL,MOTRIN) 800 MG tablet Take 1 tablet (800 mg total) by mouth 3 (three) times daily. Patient not taking: Reported on 11/11/2017 04/21/14   Liam Graham, PA-C  methylPREDNISolone (MEDROL DOSEPAK) 4 MG tablet follow package directions, start on thurs, take until finished. Patient not taking: Reported on 11/11/2017 08/24/14   Billy Fischer, MD  pantoprazole (PROTONIX) 20 MG tablet Take 1 tablet (20 mg total) by mouth daily. 11/11/17   Ireene Ballowe, PA-C  predniSONE (DELTASONE)  10 MG tablet 4 tabs PO QD for 4 days; 3 tabs PO QD for 3 days; 2 tabs PO QD for 2 days; 1 tab PO QD for 1 day Patient not taking: Reported on 11/11/2017 04/21/14   Liam Graham, PA-C  senna-docusate (SENOKOT-S) 8.6-50 MG per tablet Take 1 tablet by mouth at bedtime as needed for mild constipation. Patient not taking: Reported on 11/11/2017 03/13/14   Olam Idler, MD  traMADol (ULTRAM) 50 MG tablet Take 1 tablet (50 mg total) by mouth every 6 (six) hours as needed. Patient not taking: Reported on 11/11/2017 04/21/14   Liam Graham, PA-C    Family History Family History  Problem Relation Age of Onset  . Depression Mother   . Heart disease Father        stent put in    Social History Social History   Tobacco Use  . Smoking status: Current Every Day Smoker    Packs/day: 1.00    Years: 24.00    Pack years: 24.00    Types: Cigarettes  . Smokeless tobacco: Never Used  Substance Use Topics  . Alcohol use: Yes    Alcohol/week: 12.0 oz    Types: 10 Glasses of wine, 10 Cans of beer per week  . Drug use: Yes    Comment: 03/07/2014 "marijuana roughly once a month"     Allergies   Patient has no known allergies.   Review of Systems Review of Systems  Respiratory: Positive for shortness of breath.   Cardiovascular: Positive for chest pain.  All other systems reviewed and are negative.    Physical Exam Updated Vital Signs BP 124/84 (BP Location: Right Arm)   Pulse 98   Temp 99 F (37.2 C) (Oral)   Resp 18   Ht 5\' 4"  (1.626 m)   Wt 49.4 kg (109 lb)   SpO2 96%   BMI 18.71 kg/m   Physical Exam  Constitutional: She is oriented to person, place, and time. She appears well-developed and well-nourished. No distress.  Patient appears in no distress  HENT:  Head: Normocephalic and atraumatic.  Eyes: Pupils are equal, round, and reactive to light. Conjunctivae and EOM are normal.  Neck: Normal range of motion. Neck supple.  Cardiovascular: Normal rate, regular rhythm and  intact distal pulses.  Pulmonary/Chest: Effort normal and breath sounds normal. No respiratory distress. She has no wheezes. She exhibits no tenderness.  Speaking in full sentences.  Clear lung sounds in all fields.  Tenderness to palpation of anterior chest wall.  Abdominal: Soft. She exhibits no distension and no mass. There is no tenderness. There is no guarding.  Musculoskeletal: Normal range of motion. She exhibits no edema or tenderness.  No leg pain or swelling  Neurological: She is alert and oriented to person, place, and time.  No sensory deficit.  Skin: Skin is warm and dry. Capillary refill takes less than 2 seconds.  Psychiatric: She has a normal mood and affect.  Nursing note and vitals reviewed.    ED Treatments / Results  Labs (all labs ordered are listed, but only abnormal results are displayed) Labs Reviewed  BASIC METABOLIC PANEL - Abnormal; Notable for the following components:      Result Value   Glucose, Bld 115 (*)    All other components within normal limits  CBC - Abnormal; Notable for the following components:   MCV 104.0 (*)    MCH 35.7 (*)    All other components within normal limits  D-DIMER, QUANTITATIVE (NOT AT [facility]) - Abnormal; Notable for the following components:   D-Dimer, Quant 0.51 (*)    All other components within normal limits  I-STAT TROPONIN, ED  I-STAT BETA HCG BLOOD, ED ([facility], [facility], [facility] ONLY)  I-STAT TROPONIN, ED    EKG EKG Interpretation  Date/Time:  Tuesday November 11 2017 12:14:37 EDT Ventricular Rate:  76 PR Interval:  132 QRS Duration: 80 QT Interval:  426 QTC Calculation: 479 R Axis:   99 Text Interpretation:  Normal sinus rhythm Rightward axis Borderline ECG aside from artifact, appears similar to previous Confirmed by Theotis Burrow [phone]) on 11/11/2017 3:54:21 PM   Radiology Dg Chest 2 View  Result Date: 11/11/2017 CLINICAL DATA:  Chest pain EXAM: CHEST - 2 VIEW COMPARISON:  03/13/2017 FINDINGS: Normal heart size. Lungs clear.  No pneumothorax. No pleural effusion. IMPRESSION: No active cardiopulmonary disease. Electronically Signed   By: Marybelle Killings M.D.   On: 11/11/2017 12:55   Ct Angio Chest Pe W/cm &/or Wo Cm  Result Date: 11/11/2017 CLINICAL DATA:  Acute chest pain.  Elevated D-dimer. EXAM: CT ANGIOGRAPHY CHEST WITH CONTRAST TECHNIQUE: Multidetector CT imaging of the chest was performed using the standard protocol during bolus administration of intravenous contrast. Multiplanar CT image reconstructions and MIPs were obtained to evaluate the vascular anatomy. CONTRAST:  14mL ISOVUE-370 IOPAMIDOL (ISOVUE-370) INJECTION 76% COMPARISON:  Chest x-ray from same day. FINDINGS: Cardiovascular: Satisfactory opacification of the pulmonary arteries to the segmental level. No evidence of pulmonary embolism. Normal heart size. No pericardial effusion. Normal caliber thoracic aorta. Mediastinum/Nodes: No enlarged mediastinal, hilar, or axillary lymph nodes. Thyroid gland, trachea, and esophagus demonstrate no significant findings. Lungs/Pleura: Lungs are clear. No pleural effusion or pneumothorax. No suspicious pulmonary nodule. Upper Abdomen: No acute abnormality. Musculoskeletal: No chest wall abnormality. No acute or significant osseous findings. Review of the MIP images confirms the above findings. IMPRESSION: 1. No evidence of pulmonary embolism. No acute intrathoracic process. Electronically Signed   By: Titus Dubin M.D.   On: 11/11/2017 20:30    Procedures Procedures (including critical care time)  Medications Ordered in ED Medications  gi cocktail (Maalox,Lidocaine,Donnatal) (30 mLs Oral Given 11/11/17 1629)  iopamidol (ISOVUE-370) 76 % injection 100 mL (100 mLs Intravenous Contrast Given 11/11/17 2002)  pantoprazole (PROTONIX) EC tablet 40 mg (40 mg Oral Given 11/11/17 2121)     Initial Impression / Assessment and Plan / ED Course  I have reviewed the triage vital signs and the nursing notes.  Pertinent labs & imaging  results that were available during my care of the patient were reviewed by me and considered in my medical decision making (see chart for details).     Patient presenting for evaluation of chest pain, worse with inspiration and laying flat.  Physical exam shows patient who is afebrile not  tachycardic.  She appears nontoxic.  Initial labs reassuring, no leukocytosis.  Initial troponin negative.  Electrolytes stable.  Will obtain d-dimer to assess for PE.  Chest x-ray without pneumonia, pneumothorax, or effusions.  EKG without STEMI, similar to previous.  Dimer mildly elevated.  Delta troponin negative.  Will obtain CTA to rule out PE.  GI cocktail given for pain control.  On reassessment, patient reports pain has resolved with GI cocktail.  CTA negative.  At this time, doubt ACS, PE, myocarditis, infection. Heart score 3, low risk. Discussed GERD may be possible cause of CP, will trial PPI. Discussed f/u with PCP for further evaluation.  At this time, patient appears safe for discharge.  Return precautions given.  Patient states she understands agrees plan.  Final Clinical Impressions(s) / ED Diagnoses   Final diagnoses:  Atypical chest pain    ED Discharge Orders        Ordered    pantoprazole (PROTONIX) 20 MG tablet  Daily     11/11/17 2113       Franchot Heidelberg, PA-C 11/12/17 0126    Little, Wenda Overland, MD 11/12/17 1425

## 2017-12-21 ENCOUNTER — Emergency Department (HOSPITAL_COMMUNITY): Payer: Self-pay

## 2017-12-21 ENCOUNTER — Other Ambulatory Visit: Payer: Self-pay

## 2017-12-21 ENCOUNTER — Emergency Department (HOSPITAL_COMMUNITY)
Admission: EM | Admit: 2017-12-21 | Discharge: 2017-12-21 | Disposition: A | Discharge status: Home / Self Care | Payer: Self-pay | Attending: Emergency Medicine | Admitting: Emergency Medicine

## 2017-12-21 DIAGNOSIS — Z79899 Other long term (current) drug therapy: Secondary | ICD-10-CM | POA: Insufficient documentation

## 2017-12-21 DIAGNOSIS — F17210 Nicotine dependence, cigarettes, uncomplicated: Secondary | ICD-10-CM | POA: Insufficient documentation

## 2017-12-21 DIAGNOSIS — S0990XA Unspecified injury of head, initial encounter: Secondary | ICD-10-CM | POA: Insufficient documentation

## 2017-12-21 DIAGNOSIS — R6884 Jaw pain: Secondary | ICD-10-CM | POA: Insufficient documentation

## 2017-12-21 DIAGNOSIS — Y999 Unspecified external cause status: Secondary | ICD-10-CM | POA: Insufficient documentation

## 2017-12-21 DIAGNOSIS — Y929 Unspecified place or not applicable: Secondary | ICD-10-CM | POA: Insufficient documentation

## 2017-12-21 DIAGNOSIS — Y9389 Activity, other specified: Secondary | ICD-10-CM | POA: Insufficient documentation

## 2017-12-21 DIAGNOSIS — M79601 Pain in right arm: Secondary | ICD-10-CM | POA: Insufficient documentation

## 2017-12-21 DIAGNOSIS — J45909 Unspecified asthma, uncomplicated: Secondary | ICD-10-CM | POA: Insufficient documentation

## 2017-12-21 MED ORDER — HYDROCODONE-ACETAMINOPHEN 5-325 MG PO TABS
1.0000 | ORAL_TABLET | Freq: Once | ORAL | Status: AC
  Administered 2017-12-21: 1 via ORAL
  Filled 2017-12-21: qty 1

## 2017-12-21 MED ORDER — NAPROXEN 375 MG PO TABS: 375.0000 mg | ORAL_TABLET | Freq: Two times a day (BID) | ORAL | 0 refills | Status: DC

## 2017-12-21 MED ORDER — LIDOCAINE 5 % EX PTCH: 1.0000 | MEDICATED_PATCH | CUTANEOUS | 0 refills | Status: DC
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
## 2017-12-21 NOTE — ED Notes (Signed)
Pt discharged from ED; instructions provided and scripts given; Pt encouraged to return to ED if symptoms worsen and to f/u with PCP; Pt verbalized understanding of all instructions 
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
## 2017-12-21 NOTE — Discharge Instructions (Signed)
Your images did not show any signs of fracture.  This is likely musculoskeletal pain and bruising.  Apply ice to the affected area.  Wear the sling for comfort. Please take the Naproxen as prescribed for pain. Do not take any additional NSAIDs including Motrin, Aleve, Ibuprofen, Advil.  Watch for signs of gastrointestinal bleeding including vomiting of blood, blood in your stool.  Recommend taking Tylenol around-the-clock to also help with pain.  Have given you lidocaine patches to apply to your shoulder to help with the pain.  Follow with orthopedic doctor return the ED with any worsening symptoms.

## 2018-05-05 ENCOUNTER — Emergency Department (HOSPITAL_COMMUNITY)
Admission: EM | Admit: 2018-05-05 | Discharge: 2018-05-05 | Disposition: A | Discharge status: Home / Self Care | Payer: Self-pay | Attending: Emergency Medicine | Admitting: Emergency Medicine

## 2018-05-05 ENCOUNTER — Emergency Department (HOSPITAL_COMMUNITY): Payer: Self-pay

## 2018-05-05 ENCOUNTER — Other Ambulatory Visit: Payer: Self-pay

## 2018-05-05 ENCOUNTER — Encounter (HOSPITAL_COMMUNITY): Payer: Self-pay

## 2018-05-05 DIAGNOSIS — Z79899 Other long term (current) drug therapy: Secondary | ICD-10-CM | POA: Insufficient documentation

## 2018-05-05 DIAGNOSIS — R112 Nausea with vomiting, unspecified: Secondary | ICD-10-CM | POA: Insufficient documentation

## 2018-05-05 DIAGNOSIS — Z8541 Personal history of malignant neoplasm of cervix uteri: Secondary | ICD-10-CM | POA: Insufficient documentation

## 2018-05-05 DIAGNOSIS — R109 Unspecified abdominal pain: Secondary | ICD-10-CM

## 2018-05-05 DIAGNOSIS — I10 Essential (primary) hypertension: Secondary | ICD-10-CM | POA: Insufficient documentation

## 2018-05-05 DIAGNOSIS — R1031 Right lower quadrant pain: Secondary | ICD-10-CM | POA: Insufficient documentation

## 2018-05-05 DIAGNOSIS — F17210 Nicotine dependence, cigarettes, uncomplicated: Secondary | ICD-10-CM | POA: Insufficient documentation

## 2018-05-05 DIAGNOSIS — J45909 Unspecified asthma, uncomplicated: Secondary | ICD-10-CM | POA: Insufficient documentation

## 2018-05-05 LAB — COMPREHENSIVE METABOLIC PANEL
ALT: 22 U/L (ref 0–44)
AST: 65 U/L — ABNORMAL HIGH (ref 15–41)
Albumin: 3.7 g/dL (ref 3.5–5.0)
Alkaline Phosphatase: 75 U/L (ref 38–126)
Anion gap: 7 (ref 5–15)
BUN: 5 mg/dL — ABNORMAL LOW (ref 6–20)
CO2: 24 mmol/L (ref 22–32)
Calcium: 9.4 mg/dL (ref 8.9–10.3)
Chloride: 108 mmol/L (ref 98–111)
Creatinine, Ser: 0.67 mg/dL (ref 0.44–1.00)
GFR calc Af Amer: >60 mL/min (ref >60)
GFR calc non Af Amer: >60 mL/min (ref >60)
Glucose, Bld: 98 mg/dL (ref 70–99)
Potassium: 3.8 mmol/L (ref 3.5–5.1)
Sodium: 139 mmol/L (ref 135–145)
Total Bilirubin: 1.3 mg/dL — ABNORMAL HIGH (ref 0.3–1.2)
Total Protein: 7.3 g/dL (ref 6.5–8.1)

## 2018-05-05 LAB — CBC
HCT: 39.9 % (ref 36.0–46.0)
Hemoglobin: 12.8 g/dL (ref 12.0–15.0)
MCH: 33.8 pg (ref 26.0–34.0)
MCHC: 32.1 g/dL (ref 30.0–36.0)
MCV: 105.3 fL — ABNORMAL HIGH (ref 80.0–100.0)
Platelets: 180 10*3/uL (ref 150–400)
RBC: 3.79 MIL/uL — ABNORMAL LOW (ref 3.87–5.11)
RDW: 12 % (ref 11.5–15.5)
WBC: 5 10*3/uL (ref 4.0–10.5)
nRBC: 0 % (ref 0.0–0.2)

## 2018-05-05 LAB — URINALYSIS, ROUTINE W REFLEX MICROSCOPIC
Bilirubin Urine: NEGATIVE
Glucose, UA: NEGATIVE mg/dL
Hgb urine dipstick: NEGATIVE
Ketones, ur: NEGATIVE mg/dL
Leukocytes, UA: NEGATIVE
Nitrite: NEGATIVE
Protein, ur: NEGATIVE mg/dL
Specific Gravity, Urine: 1.014 (ref 1.005–1.030)
pH: 6 (ref 5.0–8.0)

## 2018-05-05 LAB — I-STAT BETA HCG BLOOD, ED (MC, WL, AP ONLY): I-stat hCG, quantitative: <5 m[IU]/mL (ref <5)

## 2018-05-05 LAB — WET PREP, GENITAL
CLUE CELLS WET PREP: NONE SEEN
Sperm: NONE SEEN
TRICH WET PREP: NONE SEEN
YEAST WET PREP: NONE SEEN

## 2018-05-05 LAB — LIPASE, BLOOD: Lipase: 43 U/L (ref 11–51)

## 2018-05-05 MED ORDER — ONDANSETRON HCL 4 MG/2ML IJ SOLN
4.0000 mg | Freq: Once | INTRAMUSCULAR | Status: AC
  Administered 2018-05-05: 4 mg via INTRAVENOUS
  Filled 2018-05-05: qty 2

## 2018-05-05 MED ORDER — IOHEXOL 300 MG/ML  SOLN
100.0000 mL | Freq: Once | INTRAMUSCULAR | Status: AC | PRN
  Administered 2018-05-05: 100 mL via INTRAVENOUS

## 2018-05-05 MED ORDER — SODIUM CHLORIDE 0.9 % IV BOLUS
1000.0000 mL | Freq: Once | INTRAVENOUS | Status: AC
  Administered 2018-05-05: 1000 mL via INTRAVENOUS

## 2018-05-05 MED ORDER — MORPHINE SULFATE (PF) 4 MG/ML IV SOLN
4.0000 mg | Freq: Once | INTRAVENOUS | Status: AC
  Administered 2018-05-05: 4 mg via INTRAVENOUS
  Filled 2018-05-05: qty 1

## 2018-05-05 NOTE — ED Notes (Signed)
Pt off the floor for imaging.

## 2018-05-05 NOTE — Discharge Instructions (Addendum)

## 2018-05-05 NOTE — ED Notes (Signed)
Pt verbalized understanding of discharge instructions and denies any further questions at this time.   

## 2018-05-05 NOTE — ED Provider Notes (Signed)
[facility] EMERGENCY DEPARTMENT Provider Note   CSN: [id number] Arrival date & time: 05/05/18  1404     History   Chief Complaint Chief Complaint  Patient presents with  . Abdominal Pain    HPI Whitney Murphy is a 47 y.o. female.  HPI   Pt is a 47 y/o female with a h/o asthma, cervical CA, nephorolithiasis, who presents to the ED today c/o RLQ abd pain that began 2 days ago. She states that pain began suddenly after "feeling a pop". Reports pain is "sharp and seering". Pain is worse with moving, coughing, sneezing. Pain is rated at 3/10. Pain is intermittent, and is not present when she is not moving. Pt reports sweats, nausea, vomiting (x1), diarrhea (chronic),  Denies fevers, constipation, urinary sxs, vaginal bleeding, vaginal discharge.   Past Medical History:  Diagnosis Date  . Anxiety   . Arthritis    "neck" (03/07/2014)  . Asthma   . Cellulitis of axilla, left 03/07/2014  . Cervical cancer ([place])    Dr. Anastasia Pall   . Chronic ear infection   . Depression   . Fractured coccyx ([place])    x2  . High triglycerides    hx  . Hypertension    hx  . Nephrolithiasis     Patient Active Problem List   Diagnosis Date Noted  . Cellulitis 03/07/2014    Past Surgical History:  Procedure Laterality Date  . CERVICAL CONE BIOPSY  ~ 1999   just in lining, normal pap since   . DILATION AND CURETTAGE OF UTERUS  1991   "miscarriage"  . DILATION AND EVACUATION  ~ 2008  . INCISION AND DRAINAGE ABSCESS Left 03/11/2014   Procedure: INCISION AND DRAINAGE ABSCESS;  Surgeon: Georganna Skeans, MD;  Location: [facility];  Service: General;  Laterality: Left;  . TONSILLECTOMY       OB History   None      Home Medications    Prior to Admission medications   Medication Sig Start Date End Date Taking? Authorizing Provider  CHANTIX 1 MG tablet Take 1 mg by mouth 2 (two) times daily. 04/14/18  Yes [provider]  mirtazapine (REMERON) 15 MG tablet Take 15  mg by mouth at bedtime. 04/14/18  Yes [provider]  sertraline (ZOLOFT) 100 MG tablet Take 100 mg by mouth daily.   Yes [provider]  traZODone (DESYREL) 100 MG tablet Take 100 mg by mouth at bedtime as needed for sleep.   Yes [provider]  cyclobenzaprine (FLEXERIL) 5 MG tablet Take 1 tablet (5 mg total) by mouth 3 (three) times daily as needed for muscle spasms. Patient not taking: Reported on 11/11/2017 08/24/14   Billy Fischer, MD  HYDROcodone-acetaminophen (NORCO/VICODIN) 5-325 MG per tablet Take 1-2 tablets by mouth every 6 (six) hours. Patient not taking: Reported on 11/11/2017 03/16/14   Reyne Dumas, MD  hydrOXYzine (VISTARIL) 50 MG capsule Take 1 capsule (50 mg total) by mouth 3 (three) times daily as needed for anxiety. Patient not taking: Reported on 11/11/2017 03/16/14   Reyne Dumas, MD  ibuprofen (ADVIL,MOTRIN) 800 MG tablet Take 1 tablet (800 mg total) by mouth 3 (three) times daily. Patient not taking: Reported on 11/11/2017 04/21/14   Allena Katz H, PA-C  lidocaine (LIDODERM) 5 % Place 1 patch onto the skin daily. Remove & Discard patch within 12 hours or as directed by MD Patient not taking: Reported on 05/05/2018 12/21/17   Doristine Devoid, PA-C  methylPREDNISolone (MEDROL DOSEPAK) 4 MG tablet follow package directions, start on thurs, take until finished. Patient not taking: Reported on 11/11/2017 08/24/14   Billy Fischer, MD  naproxen (NAPROSYN) 375 MG tablet Take 1 tablet (375 mg total) by mouth 2 (two) times daily. Patient not taking: Reported on 05/05/2018 12/21/17   Ocie Cornfield T, PA-C  pantoprazole (PROTONIX) 20 MG tablet Take 1 tablet (20 mg total) by mouth daily. Patient not taking: Reported on 05/05/2018 11/11/17   Caccavale, Sophia, PA-C  predniSONE (DELTASONE) 10 MG tablet 4 tabs PO QD for 4 days; 3 tabs PO QD for 3 days; 2 tabs PO QD for 2 days; 1 tab PO QD for 1 day Patient not taking: Reported on 11/11/2017 04/21/14   Liam Graham, PA-C  senna-docusate (SENOKOT-S) 8.6-50 MG per tablet Take 1 tablet by mouth at bedtime as needed for mild constipation. Patient not taking: Reported on 11/11/2017 03/13/14   Olam Idler, MD  traMADol (ULTRAM) 50 MG tablet Take 1 tablet (50 mg total) by mouth every 6 (six) hours as needed. Patient not taking: Reported on 11/11/2017 04/21/14   Liam Graham, PA-C    Family History Family History  Problem Relation Age of Onset  . Depression Mother   . Heart disease Father        stent put in    Social History Social History   Tobacco Use  . Smoking status: Current Every Day Smoker    Packs/day: 1.00    Years: 24.00    Pack years: 24.00    Types: Cigarettes  . Smokeless tobacco: Never Used  Substance Use Topics  . Alcohol use: Yes    Alcohol/week: 20.0 standard drinks    Types: 10 Glasses of wine, 10 Cans of beer per week  . Drug use: Yes    Comment: 03/07/2014 "marijuana roughly once a month"     Allergies   Patient has no known allergies.   Review of Systems Review of Systems  Constitutional: Positive for diaphoresis. Negative for chills and fever.  HENT: Negative for congestion.   Eyes: Negative for visual disturbance.  Respiratory: Negative for shortness of breath.   Cardiovascular: Negative for chest pain.  Gastrointestinal: Positive for abdominal pain, diarrhea, nausea and vomiting. Negative for constipation.  Genitourinary: Positive for pelvic pain. Negative for dysuria, flank pain, frequency, hematuria, urgency, vaginal bleeding and vaginal discharge.  Musculoskeletal: Negative for back pain.  Neurological: Negative for headaches.    Physical Exam Updated Vital Signs BP (!) 154/104   Pulse 89   Temp 98.5 F (36.9 C) (Oral)   Resp 16   SpO2 98%   Physical Exam  Constitutional: She appears well-developed and well-nourished. She does not appear ill. No distress.  HENT:  Head: Normocephalic and atraumatic.  Eyes: Conjunctivae are normal.    Neck: Neck supple.  Cardiovascular: Normal rate and regular rhythm.  No murmur heard. Pulmonary/Chest: Effort normal and breath sounds normal. No respiratory distress.  Abdominal: Soft. Bowel sounds are increased. There is tenderness in the right lower quadrant and suprapubic area. There is guarding. There is no rigidity and no rebound.  Genitourinary:  Genitourinary Comments: Exam performed by Rodney Booze,  exam chaperoned Date: 05/05/2018 Pelvic exam: old bruising noted superiorly to mons pubis that is TTP VULVA: normal appearing vulva with no masses, tenderness or lesion. VAGINA: normal appearing vagina with normal color and discharge, no lesions. CERVIX: normal appearing cervix without lesions, cervical motion tenderness absent, cervical os closed  with out purulent discharge; vaginal discharge. Wet prep and DNA probe for chlamydia and GC obtained.   ADNEXA: normal adnexa in size, TTP to the right adnexa, no left adnexal ttp UTERUS: uterus is normal size, shape, consistency and nontender.   Musculoskeletal: She exhibits no edema.  Neurological: She is alert.  Skin: Skin is warm and dry.  Psychiatric: She has a normal mood and affect.  Nursing note and vitals reviewed.   ED Treatments / Results  Labs (all labs ordered are listed, but only abnormal results are displayed) Labs Reviewed  WET PREP, GENITAL - Abnormal; Notable for the following components:      Result Value   WBC, Wet Prep HPF POC MODERATE (*)    All other components within normal limits  COMPREHENSIVE METABOLIC PANEL - Abnormal; Notable for the following components:   BUN 5 (*)    AST 65 (*)    Total Bilirubin 1.3 (*)    All other components within normal limits  CBC - Abnormal; Notable for the following components:   RBC 3.79 (*)    MCV 105.3 (*)    All other components within normal limits  URINALYSIS, ROUTINE W REFLEX MICROSCOPIC - Abnormal; Notable for the following components:   APPearance HAZY (*)     All other components within normal limits  LIPASE, BLOOD  I-STAT BETA HCG BLOOD, ED ([facility], [facility], [facility] ONLY)  GC/CHLAMYDIA PROBE AMP ([place]) NOT AT [facility]    EKG None  Radiology US Transvaginal Non-ob  Result Date: 05/05/2018 CLINICAL DATA:  Acute right adnexal pain. EXAM: TRANSABDOMINAL AND TRANSVAGINAL ULTRASOUND OF PELVIS DOPPLER ULTRASOUND OF OVARIES TECHNIQUE: Both transabdominal and transvaginal ultrasound examinations of the pelvis were performed. Transabdominal technique was performed for global imaging of the pelvis including uterus, ovaries, adnexal regions, and pelvic cul-de-sac. It was necessary to proceed with endovaginal exam following the transabdominal exam to visualize the endometrium and ovaries. Color and duplex Doppler ultrasound was utilized to evaluate blood flow to the ovaries. COMPARISON:  CT scan of same day. FINDINGS: Uterus Measurements: 4.3 x 2.8 x 2.1 cm = volume: 14 mL. No fibroids or other mass visualized. Endometrium Thickness: 3 mm which is within normal limits. No focal abnormality visualized. Right ovary Measurements: 1.7 x 1.1 x 0.8 cm = volume: 3 mL. Normal appearance/no adnexal mass. Left ovary Measurements: 2.3 x 1.5 x 1.5 cm = volume: 10 mL. Normal appearance/no adnexal mass. Pulsed Doppler evaluation of both ovaries demonstrates normal low-resistance arterial and venous waveforms. Other findings No abnormal free fluid. IMPRESSION: No definite abnormality seen in the pelvis. No definite evidence of ovarian torsion. Electronically Signed   By: Marijo Conception, M.D.   On: 05/05/2018 20:42   US Pelvis Complete  Result Date: 05/05/2018 CLINICAL DATA:  Acute right adnexal pain. EXAM: TRANSABDOMINAL AND TRANSVAGINAL ULTRASOUND OF PELVIS DOPPLER ULTRASOUND OF OVARIES TECHNIQUE: Both transabdominal and transvaginal ultrasound examinations of the pelvis were performed. Transabdominal technique was performed for global imaging of the pelvis including uterus, ovaries,  adnexal regions, and pelvic cul-de-sac. It was necessary to proceed with endovaginal exam following the transabdominal exam to visualize the endometrium and ovaries. Color and duplex Doppler ultrasound was utilized to evaluate blood flow to the ovaries. COMPARISON:  CT scan of same day. FINDINGS: Uterus Measurements: 4.3 x 2.8 x 2.1 cm = volume: 14 mL. No fibroids or other mass visualized. Endometrium Thickness: 3 mm which is within normal limits. No focal abnormality visualized. Right ovary Measurements: 1.7 x 1.1 x  0.8 cm = volume: 3 mL. Normal appearance/no adnexal mass. Left ovary Measurements: 2.3 x 1.5 x 1.5 cm = volume: 10 mL. Normal appearance/no adnexal mass. Pulsed Doppler evaluation of both ovaries demonstrates normal low-resistance arterial and venous waveforms. Other findings No abnormal free fluid. IMPRESSION: No definite abnormality seen in the pelvis. No definite evidence of ovarian torsion. Electronically Signed   By: Marijo Conception, M.D.   On: 05/05/2018 20:42   Ct Abdomen Pelvis W Contrast  Result Date: 05/05/2018 CLINICAL DATA:  47 year old female with history of right lower quadrant abdominal pain. Sensation of pop 2 days ago with persistent pain since that time. Nausea yesterday. EXAM: CT ABDOMEN AND PELVIS WITH CONTRAST TECHNIQUE: Multidetector CT imaging of the abdomen and pelvis was performed using the standard protocol following bolus administration of intravenous contrast. CONTRAST:  143mL OMNIPAQUE IOHEXOL 300 MG/ML  SOLN COMPARISON:  CT the abdomen and pelvis 01/04/2005. FINDINGS: Lower chest: Unremarkable. Hepatobiliary: No suspicious cystic or solid hepatic lesions. No intra or extrahepatic biliary ductal dilatation. Gallbladder is normal in appearance. Pancreas: No pancreatic mass. No pancreatic ductal dilatation. No pancreatic or peripancreatic fluid or inflammatory changes. Spleen: Unremarkable. Adrenals/Urinary Tract: Bilateral kidneys and bilateral adrenal glands are normal  in appearance. No hydroureteronephrosis. Urinary bladder is normal in appearance. Stomach/Bowel: Normal appearance of the stomach. No pathologic dilatation of small bowel or colon. Normal appendix. Vascular/Lymphatic: Aortic atherosclerosis, without evidence of aneurysm or dissection in the abdominal or pelvic vasculature. No lymphadenopathy noted in the abdomen or pelvis. Reproductive: Uterus and ovaries are unremarkable in appearance. Other: No significant volume of ascites.  No pneumoperitoneum. Musculoskeletal: There are no aggressive appearing lytic or blastic lesions noted in the visualized portions of the skeleton. IMPRESSION: 1. No acute findings are noted in the abdomen or pelvis to account for the patient's symptoms. 2. Normal appendix. 3. Aortic atherosclerosis. Electronically Signed   By: Vinnie Langton M.D.   On: 05/05/2018 20:06   [place] Art/ven Flow Abd Pelv Doppler  Result Date: 05/05/2018 CLINICAL DATA:  Acute right adnexal pain. EXAM: TRANSABDOMINAL AND TRANSVAGINAL ULTRASOUND OF PELVIS DOPPLER ULTRASOUND OF OVARIES TECHNIQUE: Both transabdominal and transvaginal ultrasound examinations of the pelvis were performed. Transabdominal technique was performed for global imaging of the pelvis including uterus, ovaries, adnexal regions, and pelvic cul-de-sac. It was necessary to proceed with endovaginal exam following the transabdominal exam to visualize the endometrium and ovaries. Color and duplex Doppler ultrasound was utilized to evaluate blood flow to the ovaries. COMPARISON:  CT scan of same day. FINDINGS: Uterus Measurements: 4.3 x 2.8 x 2.1 cm = volume: 14 mL. No fibroids or other mass visualized. Endometrium Thickness: 3 mm which is within normal limits. No focal abnormality visualized. Right ovary Measurements: 1.7 x 1.1 x 0.8 cm = volume: 3 mL. Normal appearance/no adnexal mass. Left ovary Measurements: 2.3 x 1.5 x 1.5 cm = volume: 10 mL. Normal appearance/no adnexal mass. Pulsed Doppler  evaluation of both ovaries demonstrates normal low-resistance arterial and venous waveforms. Other findings No abnormal free fluid. IMPRESSION: No definite abnormality seen in the pelvis. No definite evidence of ovarian torsion. Electronically Signed   By: Marijo Conception, M.D.   On: 05/05/2018 20:42    Procedures Procedures (including critical care time)  Medications Ordered in ED Medications  sodium chloride 0.9 % bolus 1,000 mL (0 mLs Intravenous Stopped 05/05/18 1940)  morphine 4 MG/ML injection 4 mg (4 mg Intravenous Given 05/05/18 1910)  ondansetron (ZOFRAN) injection 4 mg (4 mg Intravenous Given 05/05/18  1910)  iohexol (OMNIPAQUE) 300 MG/ML solution 100 mL (100 mLs Intravenous Contrast Given 05/05/18 1928)     Initial Impression / Assessment and Plan / ED Course  I have reviewed the triage vital signs and the nursing notes.  Pertinent labs & imaging results that were available during my care of the patient were reviewed by me and considered in my medical decision making (see chart for details).   Final Clinical Impressions(s) / ED Diagnoses   Final diagnoses:  Abdominal pain, unspecified abdominal location   Present with right lower quadrant and right adnexal tenderness.  She does have a bruise just superior to the mons pubis that is tender to palpation.  She states she fell several days ago and hurt herself there.  She thinks this may be why she has some pain.  Her labs are reassuring with a normal white count, no anemia.  CMP with normal electrolytes and kidney function.  AST is slightly elevated and appears patient has a history of this.  She also has a slightly elevated total bilirubin.  No right upper quadrant tenderness or hepatomegaly on exam.  Pregnancy test is negative.  UA was without evidence of urinary tract infection.  Wet prep does not show any evidence of BV or yeast.  CT of the abdomen pelvis shows a normal appendix and no other acute abnormality identified.   Pelvic ultrasound did not show any evidence of torsion, ovarian cysts or other abnormality.  Suspect patient's pain is related to her fall given that she has some bruising to the right lower part of the pelvis area.  Have advised over-the-counter anti-inflammatories, cool compresses and rest.  Have advised follow-up with PCP and return to the ER for any new or worsening symptoms.  Patient voiced understanding the plan reasons return.  All questions answered.  ED Discharge Orders    None       Bishop Dublin 05/05/18 2053    Virgel Manifold, MD 05/06/18 [phone]

## 2018-05-05 NOTE — ED Triage Notes (Signed)
Pt reports RLQ pain. She states she felt a pop 2 days ago and has had persistent pain. She also reports some right lower back pain. Pt reports some nausea yesterday.

## 2018-05-05 NOTE — ED Notes (Signed)
Patient verbalizes understanding of discharge instructions. Opportunity for questioning and answers were provided. Armband removed by staff, pt discharged from ED ambulatory w/ husband  

## 2018-05-06 LAB — GC/CHLAMYDIA PROBE AMP (~~LOC~~) NOT AT ARMC
CHLAMYDIA, DNA PROBE: NEGATIVE
NEISSERIA GONORRHEA: NEGATIVE

## 2018-07-24 ENCOUNTER — Telehealth (HOSPITAL_COMMUNITY): Payer: Self-pay

## 2018-07-24 NOTE — Telephone Encounter (Signed)
Returned Facilities manager. Left name and number for her to call back.

## 2018-11-11 ENCOUNTER — Other Ambulatory Visit (HOSPITAL_COMMUNITY): Payer: Self-pay | Admitting: *Deleted

## 2018-11-11 DIAGNOSIS — Z1231 Encounter for screening mammogram for malignant neoplasm of breast: Secondary | ICD-10-CM

## 2018-11-12 ENCOUNTER — Ambulatory Visit (HOSPITAL_COMMUNITY): Payer: Self-pay

## 2018-12-15 ENCOUNTER — Ambulatory Visit (HOSPITAL_COMMUNITY)
Admission: RE | Admit: 2018-12-15 | Discharge: 2018-12-15 | Disposition: A | Discharge status: Home / Self Care | Payer: Self-pay | Source: Ambulatory Visit | Attending: Obstetrics and Gynecology | Admitting: Obstetrics and Gynecology

## 2018-12-15 ENCOUNTER — Other Ambulatory Visit: Payer: Self-pay

## 2018-12-15 ENCOUNTER — Encounter (HOSPITAL_COMMUNITY): Payer: Self-pay

## 2018-12-15 DIAGNOSIS — N6320 Unspecified lump in the left breast, unspecified quadrant: Secondary | ICD-10-CM | POA: Insufficient documentation

## 2018-12-15 DIAGNOSIS — Z01419 Encounter for gynecological examination (general) (routine) without abnormal findings: Secondary | ICD-10-CM | POA: Insufficient documentation

## 2018-12-15 NOTE — Patient Instructions (Addendum)
Explained breast self awareness with Rayya A Scoles. Let patient know that her next Pap smear will be due based on the results of today's Pap smear due to her history of cervical cancer. Referred patient to [facility] for a screening mammogram. Appointment scheduled for Tuesday, December 15, 2018 at 1545. Patient aware of appointment and will be there. Let patient know will follow up with her within the next couple weeks with results of Pap smear by letter or phone. Informed patient that Teola Bradley will follow-up with her on results to screening mammogram by letter or phone. Discussed smoking cessation with patient. Referred to the [facility] Quitline and gave resources to the free smoking cessation classes at [facility]. Makenzee A Lubke verbalized understanding.  Johndavid Geralds, Arvil Chaco, RN 2:43 PM

## 2018-12-15 NOTE — Addendum Note (Signed)
Encounter addended by: Loletta Parish, RN on: 12/15/2018 3:49 PM  Actions taken: Clinical Note Signed

## 2018-12-15 NOTE — Progress Notes (Signed)
No complaints today. Complaints of a left breast lump x 10 years that has been followed. Last mammogram in September 2018 recommended a screening mammogram.  Pap Smear: Pap smear completed today. Last Pap smear was 01/11/2015 and normal. Per patient has a history of an abnormal Pap smear 20 years ago that showed stage IV cervical cancer that a D&C and CKC were completed for follow-up. Per patient all Pap smears have been normal since CKC. Last Pap smear result is in Epic.  Physical exam: Breasts Breasts symmetrical. No skin abnormalities right breast. Observed a bruise on the left breast between 3-4 o'clock around 4 cm from the nipple that was from a fall per patient. No nipple retraction bilateral breasts. No nipple discharge bilateral breasts. No lymphadenopathy. No lumps palpated right breast. Palpated a lump within the left breast at 3 o'clock next to bruise that per patient is the lump she has had for 10 years. No complaints of pain or tenderness on exam. Referred patient to [facility] for a screening mammogram. Appointment scheduled for Tuesday, December 15, 2018 at 1545.        Pelvic/Bimanual   Ext Genitalia No lesions, no swelling and no discharge observed on external genitalia.         Vagina Vagina pink and normal texture. No lesions or discharge observed in vagina.          Cervix Cervix is present. Cervix pink and of normal texture. No discharge observed.     Uterus Uterus is present and palpable. Uterus in normal position and normal size.        Adnexae Bilateral ovaries present and palpable. No tenderness on palpation.         Rectovaginal No rectal exam completed today since patient had no rectal complaints. No skin abnormalities observed on exam.    Smoking History: Patient is a current smoker. Discussed smoking cessation with patient. Referred to the [facility] Quitline and gave resources to the free smoking cessation classes at [facility].  Patient Navigation: Patient  education provided. Access to services provided for patient through [facility] program.   Breast and Cervical Cancer Risk Assessment: Patient has no family history of breast cancer, known genetic mutations, or radiation treatment to the chest before age 16. Per atient has a history of cervical dysplasia. Patient has no history of being immunocompromised or DES exposure in-utero.  Risk Assessment    Risk Scores      12/15/2018   Last edited by: Armond Hang, LPN   5-year risk: 0.8 %   Lifetime risk: 8.3 %

## 2018-12-17 ENCOUNTER — Encounter (HOSPITAL_COMMUNITY): Payer: Self-pay | Admitting: *Deleted

## 2018-12-18 LAB — CYTOLOGY - PAP
Diagnosis: NEGATIVE
HPV: NOT DETECTED

## 2019-01-05 ENCOUNTER — Encounter (HOSPITAL_COMMUNITY): Payer: Self-pay | Admitting: *Deleted

## 2019-01-05 NOTE — Progress Notes (Signed)
Letter mailed to patient with negative pap smear results. HPV negative. Next pap smear due in one year.

## 2019-02-04 ENCOUNTER — Encounter (HOSPITAL_COMMUNITY): Payer: Self-pay | Admitting: *Deleted

## 2019-07-22 ENCOUNTER — Ambulatory Visit: Payer: Self-pay | Attending: Internal Medicine

## 2019-07-22 DIAGNOSIS — Z23 Encounter for immunization: Secondary | ICD-10-CM | POA: Insufficient documentation

## 2019-07-22 NOTE — Progress Notes (Signed)
   Covid-19 Vaccination Clinic  Name:  Whitney Murphy    MRN: [id number] DOB: February 15, 1971  07/22/2019  [name] was observed post Covid-19 immunization for 15 minutes without incidence. She was provided with Vaccine Information Sheet and instruction to access the V-Safe system.   [name] was instructed to call 911 with any severe reactions post vaccine: Marland Kitchen Difficulty breathing  . Swelling of your face and throat  . A fast heartbeat  . A bad rash all over your body  . Dizziness and weakness    Immunizations Administered    Name Date Dose VIS Date Route   Pfizer COVID-19 Vaccine 07/22/2019  6:38 PM 0.3 mL 05/21/2019 Intramuscular   Manufacturer: [place]   Lot: [id number]   [place]: [id number]

## 2019-08-14 ENCOUNTER — Ambulatory Visit: Payer: Self-pay | Attending: Internal Medicine

## 2019-08-14 DIAGNOSIS — Z23 Encounter for immunization: Secondary | ICD-10-CM | POA: Insufficient documentation

## 2019-08-14 NOTE — Progress Notes (Signed)
   Covid-19 Vaccination Clinic  Name:  Whitney Murphy    MRN: [id number] DOB: 12-29-1970  08/14/2019  [name] was observed post Covid-19 immunization for 15 minutes without incident. She was provided with Vaccine Information Sheet and instruction to access the V-Safe system.   [name] was instructed to call 911 with any severe reactions post vaccine: Marland Kitchen Difficulty breathing  . Swelling of face and throat  . A fast heartbeat  . A bad rash all over body  . Dizziness and weakness   Immunizations Administered    Name Date Dose VIS Date Route   Pfizer COVID-19 Vaccine 08/14/2019  6:24 PM 0.3 mL 05/21/2019 Intramuscular   Manufacturer: [place]   Lot: [id number]   [place]: [id number]
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
## 2020-06-16 ENCOUNTER — Ambulatory Visit: Payer: Self-pay | Attending: Internal Medicine

## 2020-06-16 DIAGNOSIS — Z23 Encounter for immunization: Secondary | ICD-10-CM

## 2020-06-16 NOTE — Progress Notes (Signed)
   Covid-19 Vaccination Clinic  Name:  Whitney Murphy    MRN: [id number] DOB: 1970-12-25  06/16/2020  Ms. Whitney Murphy was observed post Covid-19 immunization for 15 minutes without incident. She was provided with Vaccine Information Sheet and instruction to access the V-Safe system.   Ms. Whitney Murphy was instructed to call 911 with any severe reactions post vaccine: Marland Kitchen Difficulty breathing  . Swelling of face and throat  . A fast heartbeat  . A bad rash all over body  . Dizziness and weakness   Immunizations Administered    Name Date Dose VIS Date Route   Pfizer COVID-19 Vaccine 06/16/2020  3:28 PM 0.3 mL 03/29/2020 Intramuscular   Manufacturer:    Lot: [id number]   [place]: [id number]

## 2020-10-17 ENCOUNTER — Encounter (HOSPITAL_COMMUNITY): Payer: Self-pay | Admitting: Emergency Medicine

## 2020-10-17 ENCOUNTER — Other Ambulatory Visit: Payer: Self-pay

## 2020-10-17 ENCOUNTER — Ambulatory Visit (HOSPITAL_COMMUNITY)
Admission: EM | Admit: 2020-10-17 | Discharge: 2020-10-17 | Disposition: A | Discharge status: Home / Self Care | Payer: Self-pay

## 2020-10-17 DIAGNOSIS — S29012A Strain of muscle and tendon of back wall of thorax, initial encounter: Secondary | ICD-10-CM

## 2020-10-17 DIAGNOSIS — Z8739 Personal history of other diseases of the musculoskeletal system and connective tissue: Secondary | ICD-10-CM

## 2020-10-17 MED ORDER — TIZANIDINE HCL 2 MG PO CAPS: 2.0000 mg | ORAL_CAPSULE | Freq: Three times a day (TID) | ORAL | 0 refills | Status: DC

## 2020-10-17 MED ORDER — PREDNISONE 20 MG PO TABS: 40.0000 mg | ORAL_TABLET | Freq: Every day | ORAL | 0 refills | Status: AC

## 2020-10-17 NOTE — Discharge Instructions (Addendum)
-  Prednisone, 2 pills taken at the same time for 5 days in a row.  Try taking this earlier in the day as it can give you energy.  -Start the muscle relaxer-Zanaflex (tizanidine), up to 3 times daily for muscle spasms and pain.  This can make you drowsy, so take at bedtime or when you do not need to drive or operate machinery. -You can also take Tylenol 1000 mg 3 times daily, and ibuprofen 800 mg 3 times daily with food.  You can take these together, or alternate every 3-4 hours. -Follow-up with primary care if symptoms worsen or persist, may consider referral to spine specialist at that time. -Seek additional medical attention if you develop new symptoms like the worst pain of your life, new numbness of your inner thighs, new urinary retention, new constipation, dizziness, shortness of breath, chest pain.

## 2020-10-17 NOTE — ED Provider Notes (Signed)
[facility]    CSN: [id number] Arrival date & time: 10/17/20  1505      History   Chief Complaint Chief Complaint  Patient presents with  . Back Pain    Right middle    HPI Whitney Murphy is a 50 y.o. female presenting with back pain.  Medical history degenerative disc disease cervical spine, nephrolithiasis, neck arthritis.  Long history of chronic back pain for which she takes gabapentin, this is prescribed by primary care provider. Notes thoracic back pain x1.5 weeks, R>L.  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 Ibuprofen providing minimal relief. Denies urinary symptoms including retention, hematuria. Denies pain shooting down legs, denies numbness in arms/legs, denies weakness in arms/legs, denies saddle anesthesia, denies bowel/bladder incontinence, denies urinary retention, denies constipation.    HPI  Past Medical History:  Diagnosis Date  . Anxiety   . Arthritis    "neck" (03/07/2014)  . Asthma   . Cellulitis of axilla, left 03/07/2014  . Cervical cancer ([place])    Dr. Anastasia Pall   . Chronic ear infection   . Depression   . Fractured coccyx ([place])    x2  . High triglycerides    hx  . Hypertension    hx  . Nephrolithiasis     Patient Active Problem List   Diagnosis Date Noted  . Well woman exam with routine gynecological exam 12/15/2018  . Left breast lump 12/15/2018  . Cellulitis 03/07/2014    Past Surgical History:  Procedure Laterality Date  . CERVICAL CONE BIOPSY  ~ 1999   just in lining, normal pap since   . DILATION AND CURETTAGE OF UTERUS  1991   "miscarriage"  . DILATION AND EVACUATION  ~ 2008  . INCISION AND DRAINAGE ABSCESS Left 03/11/2014   Procedure: INCISION AND DRAINAGE ABSCESS;  Surgeon: Georganna Skeans, MD;  Location: [facility];  Service: General;  Laterality: Left;  . TONSILLECTOMY      OB History    Gravida  3   Para      Term      Preterm       AB  2   Living  1     SAB  1   IAB  1   Ectopic      Multiple      Live Births  1            Home Medications    Prior to Admission medications   Medication Sig Start Date End Date Taking? Authorizing Provider  predniSONE (DELTASONE) 20 MG tablet Take 2 tablets (40 mg total) by mouth daily for 5 days. 10/17/20 10/22/20 Yes Whitney Sams, PA-C  tizanidine (ZANAFLEX) 2 MG capsule Take 1 capsule (2 mg total) by mouth 3 (three) times daily. 10/17/20  Yes Whitney Sams, PA-C  atorvastatin (LIPITOR) 20 MG tablet Take 1 tablet by mouth at bedtime. 09/18/20   [provider]  mirtazapine (REMERON) 15 MG tablet Take 15 mg by mouth at bedtime. 04/14/18   [provider]  NEURONTIN 300 MG capsule Take 1 capsule by mouth 2 (two) times daily. 09/18/20   [provider]  omeprazole (PRILOSEC) 20 MG capsule Take 1 capsule by mouth daily. 09/18/20   [provider]  SEROQUEL 50 MG tablet Take 50 mg by mouth at bedtime. 07/03/20   [provider]  sertraline (ZOLOFT) 100 MG  tablet Take 100 mg by mouth daily.    [provider]  traZODone (DESYREL) 100 MG tablet Take 100 mg by mouth at bedtime as needed for sleep.    [provider]  ZOLOFT 50 MG tablet Take 1 tablet by mouth daily. 09/18/20   [provider]  pantoprazole (PROTONIX) 20 MG tablet Take 1 tablet (20 mg total) by mouth daily. Patient not taking: No sig reported 11/11/17 10/17/20  Caccavale, Sophia, PA-C    Family History Family History  Problem Relation Age of Onset  . Depression Mother   . Heart disease Father        stent put in  . Diabetes Maternal Grandfather     Social History Social History   Tobacco Use  . Smoking status: Current Every Day Smoker    Packs/day: 1.00    Years: 24.00    Pack years: 24.00    Types: Cigarettes  . Smokeless tobacco: Never Used  Vaping Use  . Vaping Use: Never used  Substance Use Topics  . Alcohol use: Yes     Alcohol/week: 20.0 standard drinks    Types: 10 Glasses of wine, 10 Cans of beer per week  . Drug use: Yes    Comment: 03/07/2014 "marijuana roughly once a month"     Allergies   Patient has no known allergies.   Review of Systems Review of Systems  Constitutional: Negative for chills, fever and unexpected weight change.  Respiratory: Negative for chest tightness and shortness of breath.   Cardiovascular: Negative for chest pain and palpitations.  Gastrointestinal: Negative for abdominal pain, diarrhea, nausea and vomiting.  Genitourinary: Negative for decreased urine volume, difficulty urinating and frequency.  Musculoskeletal: Positive for back pain. Negative for arthralgias, gait problem, joint swelling, myalgias, neck pain and neck stiffness.  Skin: Negative for wound.  Neurological: Negative for dizziness, tremors, seizures, syncope, facial asymmetry, speech difficulty, weakness, light-headedness, numbness and headaches.  All other systems reviewed and are negative.    Physical Exam Triage Vital Signs ED Triage Vitals [10/17/20 1613]  Enc Vitals Group     BP      Pulse      Resp      Temp      Temp src      SpO2      Weight      Height      Head Circumference      Peak Flow      Pain Score 6     Pain Loc      Pain Edu?      Excl. in [place]?    No data found.  Updated Vital Signs BP 117/90 (BP Location: Right Arm)   Pulse 98   Temp 98.6 F (37 C) (Oral)   Resp 18   LMP 03/22/2016   SpO2 97%   Visual Acuity Right Eye Distance:   Left Eye Distance:   Bilateral Distance:    Right Eye Near:   Left Eye Near:    Bilateral Near:     Physical Exam Vitals reviewed.  Constitutional:      General: She is not in acute distress.    Appearance: Normal appearance. She is not ill-appearing.  HENT:     Head: Normocephalic and atraumatic.  Cardiovascular:     Rate and Rhythm: Normal rate and regular rhythm.     Heart sounds: Normal heart sounds.  Pulmonary:      Effort: Pulmonary effort is normal.  Breath sounds: Normal breath sounds and air entry.  Abdominal:     Tenderness: There is no abdominal tenderness. There is no right CVA tenderness, left CVA tenderness, guarding or rebound.  Musculoskeletal:     Cervical back: Normal range of motion. No swelling, deformity, signs of trauma, rigidity, spasms, tenderness, bony tenderness or crepitus. No pain with movement.     Thoracic back: Spasms and tenderness present. No swelling, deformity, signs of trauma or bony tenderness. Normal range of motion. No scoliosis.     Lumbar back: No swelling, deformity, signs of trauma, spasms, tenderness or bony tenderness. Normal range of motion. Negative right straight leg raise test and negative left straight leg raise test. No scoliosis.     Comments: Bilateral thoracic paraspinous muscle tenderness to palpation, right worse than left.  No spinous tenderness, deformity, step-off.  No cervical or lumbar paraspinous muscle tenderness.  Negative straight leg raise bilaterally.  Gait normal, strength 5 out of 5 in upper and lower extremities, sensation intact.  Appears comfortable.  Absolutely no other injury, deformity, tenderness, ecchymosis, abrasion.  Neurological:     General: No focal deficit present.     Mental Status: She is alert.     Cranial Nerves: No cranial nerve deficit.  Psychiatric:        Mood and Affect: Mood normal.        Behavior: Behavior normal.        Thought Content: Thought content normal.        Judgment: Judgment normal.      UC Treatments / Results  Labs (all labs ordered are listed, but only abnormal results are displayed) Labs Reviewed - No data to display  EKG   Radiology No results found.  Procedures Procedures (including critical care time)  Medications Ordered in UC Medications - No data to display  Initial Impression / Assessment and Plan / UC Course  I have reviewed the triage vital signs and the nursing  notes.  Pertinent labs & imaging results that were available during my care of the patient were reviewed by me and considered in my medical decision making (see chart for details).     This patient is a 50 year old female presenting with acute exacerbation of chronic back pain.  Neurovascularly intact, no red flag symptoms.  Prednisone, Zanaflex; she is not a diabetic.  Continue ibuprofen/Tylenol.  Continue gabapentin as prescribed by PCP.   Recommended following up with PCP to consider referral to spine specialist.  ED return precautions and red flag symptoms discussed.  Final Clinical Impressions(s) / UC Diagnoses   Final diagnoses:  History of degenerative disc disease  Strain of thoracic back region     Discharge Instructions     -Prednisone, 2 pills taken at the same time for 5 days in a row.  Try taking this earlier in the day as it can give you energy.  -Start the muscle relaxer-Zanaflex (tizanidine), up to 3 times daily for muscle spasms and pain.  This can make you drowsy, so take at bedtime or when you do not need to drive or operate machinery. -You can also take Tylenol 1000 mg 3 times daily, and ibuprofen 800 mg 3 times daily with food.  You can take these together, or alternate every 3-4 hours. -Follow-up with primary care if symptoms worsen or persist, may consider referral to spine specialist at that time. -Seek additional medical attention if you develop new symptoms like the worst pain of your life, new numbness of your  inner thighs, new urinary retention, new constipation, dizziness, shortness of breath, chest pain.     ED Prescriptions    Medication Sig Dispense Auth. Provider   predniSONE (DELTASONE) 20 MG tablet Take 2 tablets (40 mg total) by mouth daily for 5 days. 10 tablet Whitney Sams, PA-C   tizanidine (ZANAFLEX) 2 MG capsule Take 1 capsule (2 mg total) by mouth 3 (three) times daily. 21 capsule Whitney Sams, PA-C     PDMP not reviewed this  encounter.   Whitney Sams, PA-C 10/17/20 [phone]

## 2020-10-17 NOTE — ED Triage Notes (Signed)
Pt presents today with c/o of thoracic back pain that radiates to right side x 1.5 weeks. Denies injury.

## 2021-05-18 ENCOUNTER — Emergency Department (HOSPITAL_COMMUNITY)
Admission: EM | Admit: 2021-05-18 | Discharge: 2021-05-19 | Disposition: A | Discharge status: Home / Self Care | Payer: Self-pay | Attending: Emergency Medicine | Admitting: Emergency Medicine

## 2021-05-18 ENCOUNTER — Other Ambulatory Visit: Payer: Self-pay

## 2021-05-18 ENCOUNTER — Emergency Department (HOSPITAL_COMMUNITY): Payer: Self-pay

## 2021-05-18 ENCOUNTER — Encounter (HOSPITAL_COMMUNITY): Payer: Self-pay | Admitting: Emergency Medicine

## 2021-05-18 DIAGNOSIS — R410 Disorientation, unspecified: Secondary | ICD-10-CM | POA: Insufficient documentation

## 2021-05-18 DIAGNOSIS — F17210 Nicotine dependence, cigarettes, uncomplicated: Secondary | ICD-10-CM | POA: Insufficient documentation

## 2021-05-18 DIAGNOSIS — Z8541 Personal history of malignant neoplasm of cervix uteri: Secondary | ICD-10-CM | POA: Insufficient documentation

## 2021-05-18 DIAGNOSIS — Z79899 Other long term (current) drug therapy: Secondary | ICD-10-CM | POA: Insufficient documentation

## 2021-05-18 DIAGNOSIS — I10 Essential (primary) hypertension: Secondary | ICD-10-CM | POA: Insufficient documentation

## 2021-05-18 DIAGNOSIS — R42 Dizziness and giddiness: Secondary | ICD-10-CM | POA: Insufficient documentation

## 2021-05-18 DIAGNOSIS — J45909 Unspecified asthma, uncomplicated: Secondary | ICD-10-CM | POA: Insufficient documentation

## 2021-05-18 LAB — URINALYSIS, ROUTINE W REFLEX MICROSCOPIC
Bilirubin Urine: NEGATIVE
Glucose, UA: NEGATIVE mg/dL
Hgb urine dipstick: NEGATIVE
Ketones, ur: 20 mg/dL — AB
Leukocytes,Ua: NEGATIVE
Nitrite: NEGATIVE
Protein, ur: NEGATIVE mg/dL
Specific Gravity, Urine: 1.014 (ref 1.005–1.030)
pH: 8 (ref 5.0–8.0)

## 2021-05-18 LAB — COMPREHENSIVE METABOLIC PANEL
ALT: 33 U/L (ref 0–44)
AST: 69 U/L — ABNORMAL HIGH (ref 15–41)
Albumin: 4.2 g/dL (ref 3.5–5.0)
Alkaline Phosphatase: 85 U/L (ref 38–126)
Anion gap: 11 (ref 5–15)
BUN: 8 mg/dL (ref 6–20)
CO2: 24 mmol/L (ref 22–32)
Calcium: 9.2 mg/dL (ref 8.9–10.3)
Chloride: 98 mmol/L (ref 98–111)
Creatinine, Ser: 0.65 mg/dL (ref 0.44–1.00)
GFR, Estimated: >60 mL/min (ref >60)
Glucose, Bld: 87 mg/dL (ref 70–99)
Potassium: 3.6 mmol/L (ref 3.5–5.1)
Sodium: 133 mmol/L — ABNORMAL LOW (ref 135–145)
Total Bilirubin: 0.8 mg/dL (ref 0.3–1.2)
Total Protein: 8.2 g/dL — ABNORMAL HIGH (ref 6.5–8.1)

## 2021-05-18 LAB — CBC WITH DIFFERENTIAL/PLATELET
Abs Immature Granulocytes: 0.03 10*3/uL (ref 0.00–0.07)
Basophils Absolute: 0 10*3/uL (ref 0.0–0.1)
Basophils Relative: 0 %
Eosinophils Absolute: 0 10*3/uL (ref 0.0–0.5)
Eosinophils Relative: 0 %
HCT: 36.3 % (ref 36.0–46.0)
Hemoglobin: 12.2 g/dL (ref 12.0–15.0)
Immature Granulocytes: 0 %
Lymphocytes Relative: 24 %
Lymphs Abs: 1.8 10*3/uL (ref 0.7–4.0)
MCH: 34.7 pg — ABNORMAL HIGH (ref 26.0–34.0)
MCHC: 33.6 g/dL (ref 30.0–36.0)
MCV: 103.1 fL — ABNORMAL HIGH (ref 80.0–100.0)
Monocytes Absolute: 1.1 10*3/uL — ABNORMAL HIGH (ref 0.1–1.0)
Monocytes Relative: 15 %
Neutro Abs: 4.5 10*3/uL (ref 1.7–7.7)
Neutrophils Relative %: 61 %
Platelets: 313 10*3/uL (ref 150–400)
RBC: 3.52 MIL/uL — ABNORMAL LOW (ref 3.87–5.11)
RDW: 14.1 % (ref 11.5–15.5)
WBC: 7.5 10*3/uL (ref 4.0–10.5)
nRBC: 0 % (ref 0.0–0.2)

## 2021-05-18 LAB — CK: Total CK: 97 U/L (ref 38–234)

## 2021-05-18 NOTE — ED Triage Notes (Signed)
Pt here complaining of brain fog, trouble swallowing, body tremors, thumb numbness bilaterally, lightheadedness off and on for the past year and a half. Pt reports vomiting recently, states her anxiety symptoms have been worse in the past few months.

## 2021-05-18 NOTE — ED Provider Notes (Signed)
Emergency Medicine Provider Triage Evaluation Note  Idil A Ewald , a 50 y.o. female  was evaluated in triage.  Pt complains of brain fog, trouble swallowing, body tremors, thumb numbness bilaterally, lightheadedness off and on for the past year and a half. "Violent" vomiting. Has been seen by her PCP for these issues and has been told she needs an endoscopy. DOE for the past year and a half. She reports everything has been worsening for the past 3-4 months. History of anxiety.   Review of Systems  Positive: Trouble swallowing, body tremors, thumb numbness, lightheadedness, DOE, brain fog Negative: Chest pain, headache, blurry vision  Physical Exam  BP (!) 146/104 (BP Location: Right Arm)   Pulse (!) 108   Temp 98.8 F (37.1 C)   Resp 18   LMP 03/22/2016   SpO2 98%  Gen:   Awake, no distress, anxious, tremulous   Resp:  Normal effort  MSK:   Moves extremities without difficulty  Other:    Medical Decision Making  Medically screening exam initiated at 7:05 PM.  Appropriate orders placed.  Naevia A Charland was informed that the remainder of the evaluation will be completed by another provider, this initial triage assessment does not replace that evaluation, and the importance of remaining in the ED until their evaluation is complete.  Labs ordered   Sherrell Puller, Hershal Coria 05/18/21 1909    Tegeler, Gwenyth Allegra, MD 05/18/21 2245

## 2021-05-19 ENCOUNTER — Emergency Department (HOSPITAL_COMMUNITY): Payer: Self-pay

## 2021-05-19 NOTE — Discharge Instructions (Addendum)
Please read and follow all provided instructions.  Your diagnoses today include:  1. Dizziness   2. Confusion     Tests performed today include: CT head: no problems seen Complete blood cell count:  Basic metabolic panel:  Urinalysis (urine test):  Vital signs. See below for your results today.   Medications prescribed:  None  Take any prescribed medications only as directed.  Home care instructions:  Follow any educational materials contained in this packet.  BE VERY CAREFUL not to take multiple medicines containing Tylenol (also called acetaminophen). Doing so can lead to an overdose which can damage your liver and cause liver failure and possibly death.   Follow-up instructions: Please follow-up with your primary care provider in the next 3 days for further evaluation of your symptoms.   Return instructions:  Please return to the Emergency Department if you experience worsening symptoms.  Return if you have weakness in your arms or legs, slurred speech, trouble walking or talking, confusion, or trouble with your balance.  Please return if you have any other emergent concerns.  Additional Information:  Your vital signs today were: BP (!) 149/108 (BP Location: Right Arm)   Pulse 78   Temp 97.9 F (36.6 C) (Oral)   Resp 16   LMP 03/22/2016   SpO2 97%  If your blood pressure (BP) was elevated above 135/85 this visit, please have this repeated by your doctor within one month. --------------

## 2021-05-19 NOTE — ED Provider Notes (Signed)
[facility]GENCY DEPARTMENT Provider Note   CSN: [id number] Arrival date & time: 05/18/21  1642     History No chief complaint on file.   Whitney Murphy is a 50 y.o. female.  Patient with history of hypertension, high triglycerides presents to the emergency department for evaluation of episodes of multiple symptoms.  Patient states that over the past 5 to 6 months she will intermittently have episodes of imbalance, like the ground is moving into her, body shaking, word finding difficulties, reading difficulties, trouble swallowing.  Sometimes, but not always, she will have headaches with this and also vomiting.  These episodes will last hours to a couple of days.  Initially they were only occurring every 6 weeks to 2 months however recently they have been occurring more frequently.  Last episode was yesterday, prompting emergency department visit.  She has not seen a neurologist for this no vision changes or unilateral weakness reported.  Even when she does not have these episodes, she states that her bilateral thumbs are numb.  The numbness is worse when she is up on her feet and better when she is lying down. The onset of this condition was acute. The course is constant. Aggravating factors: none. Alleviating factors: none.        Past Medical History:  Diagnosis Date   Anxiety    Arthritis    "neck" (03/07/2014)   Asthma    Cellulitis of axilla, left 03/07/2014   Cervical cancer ([place])    Dr. Anastasia Pall    Chronic ear infection    Depression    Fractured coccyx ([place])    x2   High triglycerides    hx   Hypertension    hx   Nephrolithiasis     Patient Active Problem List   Diagnosis Date Noted   Well woman exam with routine gynecological exam 12/15/2018   Left breast lump 12/15/2018   Cellulitis 03/07/2014    Past Surgical History:  Procedure Laterality Date   CERVICAL CONE BIOPSY  ~ 1999   just in lining, normal pap since    [place]   "miscarriage"   DILATION AND EVACUATION  ~ 2008   INCISION AND DRAINAGE ABSCESS Left 03/11/2014   Procedure: INCISION AND DRAINAGE ABSCESS;  Surgeon: Georganna Skeans, MD;  Location: [facility];  Service: General;  Laterality: Left;   TONSILLECTOMY       OB History     Gravida  3   Para      Term      Preterm      AB  2   Living  1      SAB  1   IAB  1   Ectopic      Multiple      Live Births  1           Family History  Problem Relation Age of Onset   Depression Mother    Heart disease Father        stent put in   Diabetes Maternal Grandfather     Social History   Tobacco Use   Smoking status: Every Day    Packs/day: 1.00    Years: 24.00    Pack years: 24.00    Types: Cigarettes   Smokeless tobacco: Never  Vaping Use   Vaping Use: Never used  Substance Use Topics   Alcohol use: Yes    Alcohol/week: 20.0 standard drinks  Types: 10 Glasses of wine, 10 Cans of beer per week   Drug use: Yes    Comment: 03/07/2014 "marijuana roughly once a month"    Home Medications Prior to Admission medications   Medication Sig Start Date End Date Taking? Authorizing Provider  atorvastatin (LIPITOR) 20 MG tablet Take 1 tablet by mouth at bedtime. 09/18/20   [provider]  mirtazapine (REMERON) 15 MG tablet Take 15 mg by mouth at bedtime. 04/14/18   [provider]  NEURONTIN 300 MG capsule Take 1 capsule by mouth 2 (two) times daily. 09/18/20   [provider]  omeprazole (PRILOSEC) 20 MG capsule Take 1 capsule by mouth daily. 09/18/20   [provider]  SEROQUEL 50 MG tablet Take 50 mg by mouth at bedtime. 07/03/20   [provider]  sertraline (ZOLOFT) 100 MG tablet Take 100 mg by mouth daily.    [provider]  tizanidine (ZANAFLEX) 2 MG capsule Take 1 capsule (2 mg total) by mouth 3 (three) times daily. 10/17/20   Hazel Sams, PA-C  traZODone (DESYREL) 100 MG tablet Take 100 mg by  mouth at bedtime as needed for sleep.    [provider]  ZOLOFT 50 MG tablet Take 1 tablet by mouth daily. 09/18/20   [provider]  pantoprazole (PROTONIX) 20 MG tablet Take 1 tablet (20 mg total) by mouth daily. Patient not taking: No sig reported 11/11/17 10/17/20  Caccavale, Sophia, PA-C    Allergies    Patient has no known allergies.  Review of Systems   Review of Systems  Constitutional:  Negative for fever.  HENT:  Positive for trouble swallowing. Negative for congestion, dental problem, rhinorrhea and sinus pressure.   Eyes:  Negative for photophobia, discharge, redness and visual disturbance.  Respiratory:  Negative for shortness of breath.   Cardiovascular:  Negative for chest pain.  Gastrointestinal:  Positive for nausea and vomiting.  Musculoskeletal:  Positive for gait problem. Negative for neck pain and neck stiffness.  Skin:  Negative for rash.  Neurological:  Positive for tremors, numbness and headaches. Negative for dizziness, syncope, facial asymmetry, speech difficulty, weakness and light-headedness.  Psychiatric/Behavioral:  Negative for confusion. The patient is nervous/anxious.    Physical Exam Updated Vital Signs BP (!) 135/101 (BP Location: Right Arm)   Pulse 91   Temp 98.8 F (37.1 C)   Resp 16   LMP 03/22/2016   SpO2 100%   Physical Exam Vitals and nursing note reviewed.  Constitutional:      Appearance: She is well-developed.  HENT:     Head: Normocephalic and atraumatic.     Right Ear: Tympanic membrane, ear canal and external ear normal.     Left Ear: Tympanic membrane, ear canal and external ear normal.     Nose: Nose normal.     Mouth/Throat:     Pharynx: Uvula midline.  Eyes:     General: Lids are normal.     Extraocular Movements:     Right eye: No nystagmus.     Left eye: No nystagmus.     Conjunctiva/sclera: Conjunctivae normal.     Pupils: Pupils are equal, round, and reactive to light.  Cardiovascular:     Rate  and Rhythm: Normal rate and regular rhythm.  Pulmonary:     Effort: Pulmonary effort is normal.     Breath sounds: Normal breath sounds.  Abdominal:     Palpations: Abdomen is soft.     Tenderness: There is no  abdominal tenderness.  Musculoskeletal:     Cervical back: Normal range of motion and neck supple. No tenderness or bony tenderness.  Skin:    General: Skin is warm and dry.  Neurological:     Mental Status: She is alert and oriented to person, place, and time.     GCS: GCS eye subscore is 4. GCS verbal subscore is 5. GCS motor subscore is 6.     Cranial Nerves: No cranial nerve deficit.     Sensory: No sensory deficit.     Motor: No weakness.     Coordination: Coordination normal.     Gait: Gait normal.     Comments: Upper extremity myotomes tested bilaterally:  C5 Shoulder abduction 5/5 C6 Elbow flexion/wrist extension 5/5 C7 Elbow extension 5/5 C8 Finger flexion 5/5 T1 Finger abduction 5/5  Lower extremity myotomes tested bilaterally: L2 Hip flexion 5/5 L3 Knee extension 5/5 L4 Ankle dorsiflexion 5/5 S1 Ankle plantar flexion 5/5     ED Results / Procedures / Treatments   Labs (all labs ordered are listed, but only abnormal results are displayed) Labs Reviewed  CBC WITH DIFFERENTIAL/PLATELET - Abnormal; Notable for the following components:      Result Value   RBC 3.52 (*)    MCV 103.1 (*)    MCH 34.7 (*)    Monocytes Absolute 1.1 (*)    All other components within normal limits  COMPREHENSIVE METABOLIC PANEL - Abnormal; Notable for the following components:   Sodium 133 (*)    Total Protein 8.2 (*)    AST 69 (*)    All other components within normal limits  URINALYSIS, ROUTINE W REFLEX MICROSCOPIC - Abnormal; Notable for the following components:   Ketones, ur 20 (*)    All other components within normal limits  CK    EKG EKG Interpretation  Date/Time:  Saturday May 19 2021 05:17:43 EST Ventricular Rate:  74 PR Interval:  116 QRS  Duration: 92 QT Interval:  464 QTC Calculation: 515 R Axis:   55 Text Interpretation: Normal sinus rhythm Interpretation limited secondary to artifact Abnormal ECG Confirmed by Ripley Fraise [phone]) on 05/19/2021 5:34:27 AM  Radiology DG Chest 2 View  Result Date: 05/18/2021 CLINICAL DATA:  Difficulty swallowing, lightheadedness, vomiting, short of breath EXAM: CHEST - 2 VIEW COMPARISON:  11/11/2017 FINDINGS: Frontal and lateral views of the chest demonstrate an unremarkable cardiac silhouette. No acute airspace disease, effusion, or pneumothorax. There is a subacute to chronic right lateral tenth rib fracture with abundant callus formation. No acute bony abnormalities. IMPRESSION: 1. No acute intrathoracic process. Electronically Signed   By: Randa Ngo M.D.   On: 05/18/2021 19:55    Procedures Procedures   Medications Ordered in ED Medications - No data to display  ED Course  I have reviewed the triage vital signs and the nursing notes.  Pertinent labs & imaging results that were available during my care of the patient were reviewed by me and considered in my medical decision making (see chart for details).  Patient seen and examined.  Medical work-up completed and reviewed.  No significant concerning findings in regards to labs, chest x-ray, or EKG.  Given neuro component of the symptoms, feel that it would be reasonable to obtain noncontrast head CT.  Ultimately, patient will likely need neuro follow-up if this is reassuring.  I have low concern for a stroke given that symptoms are transient.  Duration of symptoms at times, would be atypical for TIA.  Seizure not  ruled out.  Multiple sclerosis considered but the acute onset and resolution of her other symptoms make this less likely.  Patient in agreement with plan.  Vital signs reviewed and are as follows: BP (!) 135/101 (BP Location: Right Arm)   Pulse 91   Temp 98.8 F (37.1 C)   Resp 16   LMP 03/22/2016   SpO2 100%    8:06 AM head CT is negative.  Patient stable.  Placed ambulatory referral to neurology.  Patient counseled to return if they have weakness in their arms or legs, slurred speech, trouble walking or talking, confusion, trouble with their balance, or if they have any other concerns. Patient verbalizes understanding and agrees with plan.      MDM Rules/Calculators/A&P                           Patient with transient episodes as described above.  Unclear etiology at this point.  Medical work-up unrevealing.  Head CT negative.  No sign of mass lesion on CT.  No sign of obvious stroke or bleed.  Cannot rule out partial seizure, other metabolic cause her symptoms are currently resolved and she is at her baseline.  I do not feel that she requires additional evaluation here today in the emergency department, emergent neuro consultation or admission to the hospital.  Would benefit from outpatient follow-up with PCP and neurology.  Return instructions as above.   Final Clinical Impression(s) / ED Diagnoses Final diagnoses:  Dizziness  Confusion    Rx / DC Orders ED Discharge Orders          Ordered    Ambulatory referral to Neurology       Comments: An appointment is requested in approximately: 2 weeks for transient confusion, dizziness, speech and swallowing difficulty   05/19/21 0802             Carlisle Cater, PA-C 05/19/21 2956    Blanchie Dessert, MD 05/20/21 1446

## 2021-05-23 ENCOUNTER — Encounter: Payer: Self-pay | Admitting: Neurology

## 2021-05-23 ENCOUNTER — Ambulatory Visit (INDEPENDENT_AMBULATORY_CARE_PROVIDER_SITE_OTHER): Payer: Self-pay | Admitting: Neurology

## 2021-05-23 VITALS — BP 120/81 | HR 103 | Ht 64.0 in | Wt 132.0 lb

## 2021-05-23 DIAGNOSIS — R202 Paresthesia of skin: Secondary | ICD-10-CM

## 2021-05-23 DIAGNOSIS — R29898 Other symptoms and signs involving the musculoskeletal system: Secondary | ICD-10-CM

## 2021-05-23 DIAGNOSIS — R413 Other amnesia: Secondary | ICD-10-CM

## 2021-05-23 DIAGNOSIS — R251 Tremor, unspecified: Secondary | ICD-10-CM

## 2021-05-23 NOTE — Patient Instructions (Addendum)
I had a long discussion the patient and her husband regarding her multifocal symptoms of leg weakness, gait difficulty, paresthesias, tremors and memory impairment discussed plan for further evaluation and answered questions.  I check lab work for memory panel labs, ANA and hepatitis panel, HIV, MRI scan of the brain, cervical and thoracic spine.  She will return for follow-up in the future in 3 months or call earlier if necessary.

## 2021-05-23 NOTE — Progress Notes (Signed)
[facility] [address] [place]. [place] [address] [phone]       OFFICE CONSULT NOTE  Ms. Whitney Murphy Date of Birth:  02/24/1971 Medical Record Number:  [id number]   Referring MD: Carlisle Cater PA-C  Reason for Referral: Dizziness and  HPI: Whitney Murphy is a 50 year old Caucasian lady seen today for initial office consultation visit.  She is accompanied by her husband.  History is obtained from them and review of referral notes and I personally reviewed pertinent available imaging films in PACS.  She has past medical history of anxiety, depression, asthma, arthritis, hypertension and kidney stones.  Patient states in summer 2021 she has had recurrent transient multifocal intermittent symptoms.  She states it began when she was at work and all of a sudden she felt the ground shifting and legs giving out.  She had trouble walking.  EMS was called.  Patient also vomited and had some palpitations.  Her pulse and blood pressure apparently okay.  She refused to go to the hospital.  She needed help to get to her car.  She vomited again in the car.  She felt better and rested at home that day.  Since then she has had intermittent symptoms occurring at the variable frequency without any triggers.  However 7 she feels her legs are giving out she is weak she has to hold on.  At times she has difficulty speaking and finding words.  She also starts trembling and shaking in her legs as well as hands.  She is also had some intermittent numbness in the thumbs.  She feels her brain is foggy and she cannot think clearly she has trouble finding words and has noticed increased forgetfulness.  She often has to repeat herself.  She at times has difficulty pronouncing words.  She feels all her symptoms have significantly worsened in the last 6 weeks particular in the last 2 weeks or so.  She denies any headache, sore throat speech, loss of vision, double vision extremity focal weakness.  Patient  denies any history of COVID infection or any tick bites.  She does smoke 1 pack/day for 30+ years and admits to drinking 3 beers a day.  She does use marijuana 3 times a week as well.  She denies using illicit drugs.  She has not had any spine imaging studies done and a CT scan of the head on 05/19/2021 which I personally reviewed showed no acute brain abnormalities and mild changes of sinusitis.  She had lab work done on 05/18/2021 which showed complete prehensile metabolic panel unremarkable except slightly low sodium of 133.  CBC was normal.  She denies any loss of vision double vision, vertigo, will control issues of fatigue. ROS:   14 system review of systems is positive for numbness, weakness, walking difficulty, tremors, memory loss, word finding difficulty, anxiety, depression all other systems  PMH:  Past Medical History:  Diagnosis Date   Anxiety    Arthritis    "neck" (03/07/2014)   Asthma    Cellulitis of axilla, left 03/07/2014   Cervical cancer ([place])    Dr. Anastasia Pall    Chronic ear infection    Depression    Fractured coccyx ([place])    x2   High triglycerides    hx   Hypertension    hx   Nephrolithiasis     Social History:  Social History   Socioeconomic History   Marital status: Married    Spouse name: Not on  file   Number of children: 1   Years of education: Not on file   Highest education level: Some college, no degree  Occupational History   Occupation: Scientist, clinical (histocompatibility and immunogenetics): [employer]  Tobacco Use   Smoking status: Every Day    Packs/day: 1.00    Years: 24.00    Pack years: 24.00    Types: Cigarettes   Smokeless tobacco: Never  Vaping Use   Vaping Use: Never used  Substance and Sexual Activity   Alcohol use: Yes    Alcohol/week: 20.0 standard drinks    Types: 10 Glasses of wine, 10 Cans of beer per week   Drug use: Yes    Comment: 03/07/2014 "marijuana roughly once a month"   Sexual activity: Yes    Birth control/protection: None  Other Topics Concern    Not on file  Social History Narrative   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    Social Determinants of Health   Financial Resource Strain: Not on file  Food Insecurity: Not on file  Transportation Needs: Not on file  Physical Activity: Not on file  Stress: Not on file  Social Connections: Not on file  Intimate Partner Violence: Not on file    Medications:   Current Outpatient Medications on File Prior to Visit  Medication Sig Dispense Refill   atorvastatin (LIPITOR) 20 MG tablet Take 1 tablet by mouth at bedtime.     mirtazapine (REMERON) 15 MG tablet Take 15 mg by mouth at bedtime.  2   NEURONTIN 300 MG capsule Take 1 capsule by mouth 2 (two) times daily.     omeprazole (PRILOSEC) 20 MG capsule Take 1 capsule by mouth daily.     SEROQUEL 50 MG tablet Take 50 mg by mouth at bedtime.     sertraline (ZOLOFT) 100 MG tablet Take 100 mg by mouth daily.     [DISCONTINUED] pantoprazole (PROTONIX) 20 MG tablet Take 1 tablet (20 mg total) by mouth daily. (Patient not taking: No sig reported) 14 tablet 0   No current facility-administered medications on file prior to visit.    Allergies:  No Known Allergies  Physical Exam General: well developed, well nourished, middle-aged Caucasian lady seated, in no evident distress Head: head normocephalic and atraumatic.   Neck: supple with no carotid or supraclavicular bruits Cardiovascular: regular rate and rhythm, no murmurs Musculoskeletal: no deformity Skin:  no rash/petichiae Vascular:  Normal pulses all extremities  Neurologic Exam Mental Status: Awake and fully alert. Oriented to place and time. Recent and remote memory intact. Attention span, concentration and fund of knowledge appropriate. Mood and affect appropriate.  Montreal cognitive assessment ( MOCA ) scale she scored 27/30 with deficits in delayed recall and fluency.  Clock drawing 4/4.  Able to name 16 animals which can walk on 4 legs.  Geriatric depression  scale score 2 not depressed. Cranial Nerves: Fundoscopic exam reveals sharp disc margins. Pupils equal, briskly reactive to light. Extraocular movements full without nystagmus. Visual fields full to confrontation. Hearing intact. Facial sensation intact. Face, tongue, palate moves normally and symmetrically.  Motor: Normal bulk and tone. Normal strength in all tested extremity muscles.  Action tremor of outstretched upper extremities left greater than right. Sensory.: intact to touch , pinprick , position and vibratory sensation.  Coordination: Rapid alternating movements normal in all extremities. Finger-to-nose and heel-to-shin performed accurately bilaterally. Gait and Station: Arises from chair without difficulty. Stance is normal. Gait demonstrates normal stride  length and balance . Able to heel, toe and tandem walk with mild difficulty.  Reflexes: 1+ and symmetric. Toes downgoing.      ASSESSMENT: 50 year old Caucasian lady with multifocal intermittent symptoms of leg weakness tremors and memory loss and cognitive impairment of unclear etiology.  Neurological exam is quite nonfocal.  She does have underlying depression and anxiety unclear if that is contributing.     PLAN:I had a long discussion the patient and her husband regarding her multifocal symptoms of leg weakness, gait difficulty, paresthesias, tremors and memory impairment discussed plan for further evaluation and answered questions.  I check lab work for memory panel labs, ANA and hepatitis panel, HIV, MRI scan of the brain, cervical and thoracic spine.  She will return for follow-up in the future in 3 months or call earlier if necessary.  Greater than 50% time during this 45-minute consultation visit was spent in counseling and coordination of care Antony Contras, MD  Note: This document was prepared with digital dictation and possible smart phrase technology. Any transcriptional errors that result from this process are  unintentional.

## 2021-05-24 LAB — DEMENTIA PANEL
Homocysteine: 15 umol/L — ABNORMAL HIGH (ref 0.0–14.5)
RPR Ser Ql: NONREACTIVE
TSH: 1.49 u[IU]/mL (ref 0.450–4.500)
Vitamin B-12: 379 pg/mL (ref 232–1245)

## 2021-05-24 LAB — ANA COMPREHENSIVE PANEL
Anti JO-1: <0.2 AI (ref 0.0–0.9)
Centromere Ab Screen: <0.2 AI (ref 0.0–0.9)
Chromatin Ab SerPl-aCnc: <0.2 AI (ref 0.0–0.9)
ENA RNP Ab: <0.2 AI (ref 0.0–0.9)
ENA SM Ab Ser-aCnc: <0.2 AI (ref 0.0–0.9)
ENA SSA (RO) Ab: <0.2 AI (ref 0.0–0.9)
ENA SSB (LA) Ab: <0.2 AI (ref 0.0–0.9)
Scleroderma (Scl-70) (ENA) Antibody, IgG: <0.2 AI (ref 0.0–0.9)
dsDNA Ab: <1 IU/mL (ref 0–9)

## 2021-05-24 LAB — HEPATIC FUNCTION PANEL
ALT: 19 IU/L (ref 0–32)
AST: 42 IU/L — ABNORMAL HIGH (ref 0–40)
Albumin: 4.2 g/dL (ref 3.8–4.9)
Alkaline Phosphatase: 83 IU/L (ref 44–121)
Bilirubin Total: <0.2 mg/dL (ref 0.0–1.2)
Bilirubin, Direct: <0.1 mg/dL (ref 0.00–0.40)
Total Protein: 7.3 g/dL (ref 6.0–8.5)

## 2021-05-24 LAB — AMMONIA: Ammonia: 52 ug/dL (ref 34–178)

## 2021-05-24 LAB — HIV ANTIBODY (ROUTINE TESTING W REFLEX): HIV Screen 4th Generation wRfx: NONREACTIVE

## 2021-05-28 ENCOUNTER — Encounter: Payer: Self-pay | Admitting: *Deleted

## 2021-09-16 ENCOUNTER — Other Ambulatory Visit: Payer: Self-pay

## 2021-09-16 ENCOUNTER — Emergency Department (HOSPITAL_COMMUNITY)
Admission: EM | Admit: 2021-09-16 | Discharge: 2021-09-16 | Disposition: A | Discharge status: Home / Self Care | Payer: Self-pay | Attending: Emergency Medicine | Admitting: Emergency Medicine

## 2021-09-16 ENCOUNTER — Emergency Department (HOSPITAL_COMMUNITY): Payer: Self-pay

## 2021-09-16 ENCOUNTER — Encounter (HOSPITAL_COMMUNITY): Payer: Self-pay | Admitting: Emergency Medicine

## 2021-09-16 DIAGNOSIS — F10129 Alcohol abuse with intoxication, unspecified: Secondary | ICD-10-CM | POA: Insufficient documentation

## 2021-09-16 DIAGNOSIS — W11XXXA Fall on and from ladder, initial encounter: Secondary | ICD-10-CM | POA: Insufficient documentation

## 2021-09-16 DIAGNOSIS — S82852A Displaced trimalleolar fracture of left lower leg, initial encounter for closed fracture: Secondary | ICD-10-CM | POA: Insufficient documentation

## 2021-09-16 DIAGNOSIS — Z79899 Other long term (current) drug therapy: Secondary | ICD-10-CM | POA: Insufficient documentation

## 2021-09-16 DIAGNOSIS — S82892A Other fracture of left lower leg, initial encounter for closed fracture: Secondary | ICD-10-CM

## 2021-09-16 DIAGNOSIS — F10920 Alcohol use, unspecified with intoxication, uncomplicated: Secondary | ICD-10-CM

## 2021-09-16 LAB — CBC WITH DIFFERENTIAL/PLATELET
Abs Immature Granulocytes: 0.05 10*3/uL (ref 0.00–0.07)
Basophils Absolute: 0.1 10*3/uL (ref 0.0–0.1)
Basophils Relative: 1 %
Eosinophils Absolute: 0.1 10*3/uL (ref 0.0–0.5)
Eosinophils Relative: 1 %
HCT: 37.1 % (ref 36.0–46.0)
Hemoglobin: 12.5 g/dL (ref 12.0–15.0)
Immature Granulocytes: 1 %
Lymphocytes Relative: 30 %
Lymphs Abs: 2.7 10*3/uL (ref 0.7–4.0)
MCH: 34.6 pg — ABNORMAL HIGH (ref 26.0–34.0)
MCHC: 33.7 g/dL (ref 30.0–36.0)
MCV: 102.8 fL — ABNORMAL HIGH (ref 80.0–100.0)
Monocytes Absolute: 0.3 10*3/uL (ref 0.1–1.0)
Monocytes Relative: 4 %
Neutro Abs: 5.6 10*3/uL (ref 1.7–7.7)
Neutrophils Relative %: 63 %
Platelets: 351 10*3/uL (ref 150–400)
RBC: 3.61 MIL/uL — ABNORMAL LOW (ref 3.87–5.11)
RDW: 13.7 % (ref 11.5–15.5)
WBC: 8.7 10*3/uL (ref 4.0–10.5)
nRBC: 0 % (ref 0.0–0.2)

## 2021-09-16 LAB — BASIC METABOLIC PANEL
Anion gap: 10 (ref 5–15)
BUN: 12 mg/dL (ref 6–20)
CO2: 25 mmol/L (ref 22–32)
Calcium: 8.7 mg/dL — ABNORMAL LOW (ref 8.9–10.3)
Chloride: 105 mmol/L (ref 98–111)
Creatinine, Ser: 0.62 mg/dL (ref 0.44–1.00)
GFR, Estimated: >60 mL/min (ref >60)
Glucose, Bld: 92 mg/dL (ref 70–99)
Potassium: 3.7 mmol/L (ref 3.5–5.1)
Sodium: 140 mmol/L (ref 135–145)

## 2021-09-16 LAB — ETHANOL: Alcohol, Ethyl (B): 382 mg/dL (ref <10)

## 2021-09-16 MED ORDER — OXYCODONE-ACETAMINOPHEN 5-325 MG PO TABS: 1.0000 | ORAL_TABLET | Freq: Four times a day (QID) | ORAL | 0 refills | Status: DC | PRN

## 2021-09-16 MED ORDER — HYDROCODONE-ACETAMINOPHEN 5-325 MG PO TABS
1.0000 | ORAL_TABLET | Freq: Four times a day (QID) | ORAL | 0 refills | Status: DC | PRN
Start: 2021-09-16 — End: 2021-09-21

## 2021-09-16 MED ORDER — HYDROMORPHONE HCL 2 MG/ML IJ SOLN
INTRAMUSCULAR | Status: AC
  Filled 2021-09-16: qty 1

## 2021-09-16 MED ORDER — HYDROMORPHONE HCL 2 MG/ML IJ SOLN
2.0000 mg | Freq: Once | INTRAMUSCULAR | Status: AC
  Administered 2021-09-16: 1 mg via INTRAVENOUS

## 2021-09-16 NOTE — ED Provider Notes (Addendum)
?[facility] DEPT ?Provider Note ? ? ?CSN: [id number] ?Arrival date & time: 09/16/21  1744 ? ?  ? ?History ? ?Chief Complaint  ?Patient presents with  ?? Ankle Injury  ? ? ?Whitney Murphy is a 51 y.o. female who presents to the ED complaining of left ankle injury onset PTA.  Patient notes that she was on top of a ladder is approximately 8 feet in height and purposely jumped off the ladder.  She denies wanting to harm herself at this time.  She notes that she has done it before in the past for fun without injuries.  She also notes that she has been drinking today at a bar where she jumped off the ladder.  She has not tried any medications for her symptoms.  Denies numbness, tingling, redness, hitting her head, LOC, headache, neck pain, back pain.  ? ? ?The history is provided by the patient. No language interpreter was used.  ? ?  ? ?Home Medications ?Prior to Admission medications   ?Medication Sig Start Date End Date Taking? Authorizing Provider  ?oxyCODONE-acetaminophen (PERCOCET/ROXICET) 5-325 MG tablet Take 1 tablet by mouth every 6 (six) hours as needed for severe pain. 09/16/21  Yes Jadi Deyarmin A, PA-C  ?atorvastatin (LIPITOR) 20 MG tablet Take 1 tablet by mouth at bedtime. 09/18/20   [provider]  ?mirtazapine (REMERON) 15 MG tablet Take 15 mg by mouth at bedtime. 04/14/18   [provider]  ?NEURONTIN 300 MG capsule Take 1 capsule by mouth 2 (two) times daily. 09/18/20   [provider]  ?omeprazole (PRILOSEC) 20 MG capsule Take 1 capsule by mouth daily. 09/18/20   [provider]  ?SEROQUEL 50 MG tablet Take 50 mg by mouth at bedtime. 07/03/20   [provider]  ?sertraline (ZOLOFT) 100 MG tablet Take 100 mg by mouth daily.    [provider]  ?pantoprazole (PROTONIX) 20 MG tablet Take 1 tablet (20 mg total) by mouth daily. ?Patient not taking: No sig reported 11/11/17 10/17/20  Caccavale, Sophia, PA-C  ?   ? ?Allergies    ?Patient  has no known allergies.   ? ?Review of Systems   ?Review of Systems  ?Musculoskeletal:  Positive for arthralgias and joint swelling. Negative for back pain and neck pain.  ?Skin:  Positive for color change. Negative for wound.  ?Neurological:  Negative for syncope and headaches.  ?All other systems reviewed and are negative. ? ?Physical Exam ?Updated Vital Signs ?BP 128/81   Pulse 96   Temp 98 ?F (36.7 ?C)   Resp 18   Ht '5\' 4"'$  (1.626 m)   Wt 60 kg   LMP 03/22/2016   SpO2 100%   BMI 22.71 kg/m?  ?Physical Exam ?Vitals and nursing note reviewed.  ?Constitutional:   ?   General: She is not in acute distress. ?   Appearance: Normal appearance.  ?Eyes:  ?   General: No scleral icterus. ?   Extraocular Movements: Extraocular movements intact.  ?Cardiovascular:  ?   Rate and Rhythm: Normal rate.  ?Pulmonary:  ?   Effort: Pulmonary effort is normal. No respiratory distress.  ?Musculoskeletal:  ?   Cervical back: Neck supple.  ?   Left ankle: Swelling and deformity present. Tenderness present. Decreased range of motion.  ?   Comments: Moderate tenderness to palpation to left ankle with obvious deformity noted.  Swelling noted to left ankle as well.  Decreased active range of motion of left ankle secondary to  injury.  No tenderness to palpation noted to proximal left tib-fib, knee.  Full active range of motion of the left knee.  Pedal pulses intact.  Capillary refill less than 2 seconds.  No C, T, L, S spinal tenderness to palpation.  No tenderness to palpation noted to musculature of back.   ?Skin: ?   General: Skin is warm and dry.  ?   Findings: No bruising, erythema or rash.  ?Neurological:  ?   Mental Status: She is alert.  ?Psychiatric:     ?   Behavior: Behavior normal.  ? ? ?ED Results / Procedures / Treatments   ?Labs ?(all labs ordered are listed, but only abnormal results are displayed) ?Labs Reviewed  ?BASIC METABOLIC PANEL - Abnormal; Notable for the following components:  ?    Result Value  ? Calcium 8.7  (*)   ? All other components within normal limits  ?CBC WITH DIFFERENTIAL/PLATELET - Abnormal; Notable for the following components:  ? RBC 3.61 (*)   ? MCV 102.8 (*)   ? MCH 34.6 (*)   ? All other components within normal limits  ?ETHANOL - Abnormal; Notable for the following components:  ? Alcohol, Ethyl (B) 382 (*)   ? All other components within normal limits  ? ? ?EKG ?None ? ?Radiology ?DG Tibia/Fibula Left ? ?Result Date: 09/16/2021 ?CLINICAL DATA:  Fall from ladder today. EXAM: LEFT TIBIA AND FIBULA - 2 VIEW COMPARISON:  Ankle radiographs same date. FINDINGS: Displaced trimalleolar fracture at the ankle again noted with posterolateral dislocation of the talus and posterior angulation at the fibular fracture. Allowing for positional differences, no significant change in the alignment is seen. There is no evidence of acute fracture or dislocation in the proximal lower leg or knee. There are several loose bodies posteromedially at the knee which may be within a Baker's cyst. Mild patellofemoral degenerative changes. IMPRESSION: Unchanged fracture dislocation of the left ankle. No proximal injury demonstrated. Electronically Signed   By: Richardean Sale M.D.   On: 09/16/2021 19:45  ? ?DG Ankle Complete Left ? ?Result Date: 09/16/2021 ?CLINICAL DATA:  [place] off ladder EXAM: LEFT ANKLE COMPLETE - 3+ VIEW COMPARISON:  None. FINDINGS: Acute displaced fractures involving the medial malleolus, distal shaft of the fibula and the posterior malleolus. Moderate lateral angulation of the distal fibular fracture fragment with about 1/2 shaft diameter lateral displacement. Lateral and posterior dislocation of the talus with respect to distal tibia. Generalized soft tissue swelling. IMPRESSION: Acute displaced trimalleolar fracture with lateral and posterior dislocation of the talus with respect to the distal tibia. Electronically Signed   By: Donavan Foil M.D.   On: 09/16/2021 18:25  ? ?CT Ankle Left Wo Contrast ? ?Result Date:  09/16/2021 ?CLINICAL DATA:  Left ankle fracture dislocation.  Postreduction. EXAM: CT OF THE LEFT ANKLE WITHOUT CONTRAST TECHNIQUE: Multidetector CT imaging of the left ankle was performed according to the standard protocol. Multiplanar CT image reconstructions were also generated. RADIATION DOSE REDUCTION: This exam was performed according to the departmental dose-optimization program which includes automated exposure control, adjustment of the mA and/or kV according to patient size and/or use of iterative reconstruction technique. COMPARISON:  Radiographs same date. FINDINGS: Bones/Joint/Cartilage The previously demonstrated trimalleolar fracture dislocation has been reduced, and the ankle has been splinted. The oblique fracture of the distal fibular diaphysis is mildly comminuted and minimally displaced. There is a probable small avulsive component along the anterior aspect of the lateral malleolus. The comminuted fracture of the posterior malleolus  demonstrates 3 mm of articular surface depression posteriorly. There is improved alignment of the transverse fracture through the base of the medial malleolus with 7 mm of residual displacement anteriorly. No evidence of talar dome injury, although there are multiple intra-articular fracture fragments. Only a small ankle joint effusion is present. The subtalar joint, calcaneus and additional visualized tarsal bones appear unremarkable. Ligaments Suboptimally assessed by CT. Muscles and Tendons No evidence of ankle tendon rupture or entrapment within the fractures. The posterior tibialis tendon is in close proximity to the fracture of the posteromedial tibia. Soft tissues Medial soft tissue swelling. No evidence of foreign body or soft tissue emphysema. IMPRESSION: 1. Improved alignment of trimalleolar fracture post reduction and casting. No residual dislocation. 2. The fracture of the posterior malleolus demonstrates up to 3 mm of depression of the articular surface  posteriorly. Several small fracture fragments are present within the joint. 3. No definite tarsal bone fractures or tendon injuries identified. Electronically Signed   By: Richardean Sale M.D.   On: 09/16/2021

## 2021-09-16 NOTE — ED Provider Notes (Addendum)
I provided a substantive portion of the care of this patient.  I personally performed the entirety of the history, exam, and medical decision making for this encounter. ? ?  ? ?Patient seen by me along with physician assistant.  Patient brought in by EMS.  She jumped off a ladder at a bar states that was about 8 foot.  Injuring her left ankle.  Lot of swelling to her left ankle.  Denies any other injuries but she does admit to to being intoxicated.  No loss of consciousness did not hit her head no back pain. ? ?X-ray shows an acute displaced trimalleolar fracture with lateral posterior dislocation of the talus with respect to the distal tibia. ? ? ?Looking at the foot moderate swelling to the ankle good cap refill.  Good movement of her toes.  Most likely needs a proximal tib fibula x-ray just to make sure there is no fibula fracture more approximately.  Difficult since patient is intoxicated tell exactly where the tenderness is.  Clinically looking at the foot does not look like a significant dislocation.  But obviously there is some based on the x-ray.  Think we can mold this with stair up and posterior splint. ? ?We will discuss with orthopedics.  Will most likely need open reduction internal fixation at some point in time. ? ? ?  ?Fredia Sorrow, MD ?09/16/21 1915 ? ?Discussed with Dr. Rolena Infante from [facility].  We will go ahead and place posterior and sterile up splint and reduce the ankle which is mostly a posterior dislocation.  And then he wants a CT of the ankle particular to look at the talus area. ? ?In reducing the ankle there was some difficulties were able to reduce it fine but it really had nothing to hold it in place so it may have slipped posteriorly again and trying to place a splint. ? ?Ortho is planning to fix this later.  So patient will be discharged home will use crutches.  Keep the splint in place and dry. ? ?  ?Fredia Sorrow, MD ?09/16/21 2037 ? ?

## 2021-09-16 NOTE — Progress Notes (Signed)
Orthopedic Tech Progress Note ?Patient Details:  ?Carleene Overlie ?07/16/1970 ?[id number] ? ?Ortho Devices ?Type of Ortho Device: Ace wrap, Cotton web roll, Stirrup splint, Short leg splint ?Ortho Device/Splint Location: left ?Ortho Device/Splint Interventions: Application ?  ?Post Interventions ?Patient Tolerated: Well ?Instructions Provided: Care of device ? ?[place] Pink ?09/16/2021, 7:33 PM ? ?

## 2021-09-16 NOTE — ED Notes (Signed)
Discharge instructions including pain management and follow up care discussed with pt. Pt verbalized understanding with no questions at this time. Pt to go home with spouse. Pt wheelchair to lobby by NT.  ?

## 2021-09-16 NOTE — ED Notes (Signed)
Pt removed IV by accident, site assessed. Placed bandage. Will replace IV if necessary. No other orders pending at this time.  ?

## 2021-09-16 NOTE — Discharge Instructions (Signed)
It was a pleasure taking care of you today!  ? ?Your x-ray showed fracture of your left ankle.  Attached is information for the on-call Orthopedist, Dr. Kathaleen Bury.  Call their office tomorrow, 09/17/2021 to set up a follow-up appointment this week later than 09/21/2021 regarding today's ED visit.  Use the crutches to aid with walking, you should not be walking without using the crutches. You are prescribed Percocet, take as prescribed.  Do not operate any heavy machinery or drive while taking this medication.  Return to the Emergency Department if you are experiencing increasing/worsening swelling, bruising, pain, tingling, or worsening symptoms. ? ?

## 2021-09-16 NOTE — ED Notes (Signed)
Pt called out and per pt her IV accidentally came out. Rn was notified.  ?

## 2021-09-16 NOTE — ED Notes (Signed)
Patient transported to CT 

## 2021-09-16 NOTE — ED Triage Notes (Signed)
Patient reports jumping from a ladder and breaking her L ankle today. Reports she can wiggle her toes, but barely. Swelling and deformity noted on exam.  ?

## 2021-09-20 DIAGNOSIS — S82852A Displaced trimalleolar fracture of left lower leg, initial encounter for closed fracture: Secondary | ICD-10-CM | POA: Insufficient documentation

## 2021-09-21 ENCOUNTER — Other Ambulatory Visit: Payer: Self-pay

## 2021-09-21 ENCOUNTER — Encounter (HOSPITAL_COMMUNITY): Payer: Self-pay | Admitting: Orthopaedic Surgery

## 2021-09-21 NOTE — Progress Notes (Signed)
PCP - Elbert Ewings, NP ?Cardiologist - denies ? ?PPM/ICD - denies ? ? ?Chest x-ray - 05/18/21 ?EKG - 05/22/21 ?Stress Test - denies ?ECHO - denies ?Cardiac Cath - denies ? ?Sleep Study - denies ? ? ?DM- denies ? ?ASA/Blood Thinner Instructions: pt will start taking after surgery ? ? ?ERAS Protcol - yes ? ? ?COVID TEST- n/a ? ? ?Anesthesia review: no ? ?Patient denies shortness of breath, fever, cough and chest pain  ? ? ?All instructions explained to the patient, with a verbal understanding of the material. Patient agrees to go over the instructions while at home for a better understanding. Patient also instructed to notify surgeon of any contact with COVID+ person or if she develops any symptoms. The opportunity to ask questions was provided. ?  ?

## 2021-09-22 NOTE — Discharge Instructions (Addendum)
Whitney Hang, MD ?Rosanne Gutting ? ?Please read the following information regarding your care after surgery. ? ?Medications  ?You only need a prescription for the narcotic pain medicine (ex. oxycodone, Percocet, Norco).  All of the other medicines listed below are available over the counter. ?? Aleve 2 pills twice a day for the first 3 days after surgery. ?? acetominophen (Tylenol) 650 mg every 4-6 hours as you need for minor to moderate pain ?? oxycodone as prescribed for severe pain ? ?? To help prevent blood clots, take aspirin (81 mg) twice daily for 42 days after surgery.  You should also get up every hour while you are awake to move around. ? ?Weight Bearing ?? Do NOT bear any weight on the operated leg or foot. ? ?Cast / Splint / Dressing ?? If you have a splint, do NOT remove this. Keep your splint, cast or dressing clean and dry.  Don?t put anything (coat hanger, pencil, etc) down inside of it.  If it gets wet, call the office immediately to schedule an appointment for a cast change. ? ?Swelling ?IMPORTANT: It is normal for you to have swelling where you had surgery. To reduce swelling and pain, keep at least 3 pillows under your leg so that your toes are above your nose and your heel is above the level of your hip.  It may be necessary to keep your foot or leg elevated for several weeks.  This is critical to helping your incisions heal and your pain to feel better. ? ?Follow Up ?Call my office at [phone] when you are discharged from the hospital or surgery center to schedule an appointment to be seen 7-10 days after surgery. ? ?Call my office at [phone] if you develop a fever >101.5? F, nausea, vomiting, bleeding from the surgical site or severe pain.   ? ? ?

## 2021-09-24 ENCOUNTER — Observation Stay (HOSPITAL_COMMUNITY)
Admission: RE | Admit: 2021-09-24 | Discharge: 2021-09-25 | Disposition: A | Discharge status: Home / Self Care | Payer: Self-pay | Attending: Orthopaedic Surgery | Admitting: Orthopaedic Surgery

## 2021-09-24 ENCOUNTER — Other Ambulatory Visit: Payer: Self-pay

## 2021-09-24 ENCOUNTER — Ambulatory Visit (HOSPITAL_COMMUNITY): Payer: Self-pay

## 2021-09-24 ENCOUNTER — Ambulatory Visit (HOSPITAL_COMMUNITY): Payer: Self-pay | Admitting: Anesthesiology

## 2021-09-24 ENCOUNTER — Encounter (HOSPITAL_COMMUNITY): Payer: Self-pay | Admitting: Orthopaedic Surgery

## 2021-09-24 ENCOUNTER — Encounter (HOSPITAL_COMMUNITY)
Admission: RE | Disposition: A | Discharge status: Home / Self Care | Payer: Self-pay | Source: Home / Self Care | Attending: Orthopaedic Surgery

## 2021-09-24 DIAGNOSIS — I10 Essential (primary) hypertension: Secondary | ICD-10-CM

## 2021-09-24 DIAGNOSIS — S82892A Other fracture of left lower leg, initial encounter for closed fracture: Secondary | ICD-10-CM

## 2021-09-24 DIAGNOSIS — W11XXXA Fall on and from ladder, initial encounter: Secondary | ICD-10-CM | POA: Insufficient documentation

## 2021-09-24 DIAGNOSIS — F418 Other specified anxiety disorders: Secondary | ICD-10-CM

## 2021-09-24 DIAGNOSIS — F17210 Nicotine dependence, cigarettes, uncomplicated: Secondary | ICD-10-CM | POA: Insufficient documentation

## 2021-09-24 DIAGNOSIS — S82852A Displaced trimalleolar fracture of left lower leg, initial encounter for closed fracture: Principal | ICD-10-CM | POA: Insufficient documentation

## 2021-09-24 DIAGNOSIS — M1990 Unspecified osteoarthritis, unspecified site: Secondary | ICD-10-CM

## 2021-09-24 DIAGNOSIS — Z9889 Other specified postprocedural states: Principal | ICD-10-CM

## 2021-09-24 HISTORY — PX: SYNDESMOSIS REPAIR: SHX5182

## 2021-09-24 HISTORY — DX: Gastro-esophageal reflux disease without esophagitis: K21.9

## 2021-09-24 HISTORY — DX: Personal history of urinary calculi: Z87.442

## 2021-09-24 HISTORY — PX: ORIF ANKLE FRACTURE: SHX5408

## 2021-09-24 LAB — SURGICAL PCR SCREEN
MRSA, PCR: NEGATIVE
Staphylococcus aureus: NEGATIVE

## 2021-09-24 SURGERY — OPEN REDUCTION INTERNAL FIXATION (ORIF) ANKLE FRACTURE
Anesthesia: General | Site: Ankle | Laterality: Left

## 2021-09-24 MED ORDER — CELECOXIB 200 MG PO CAPS
ORAL_CAPSULE | ORAL | Status: AC
  Administered 2021-09-24: 200 mg via ORAL
  Filled 2021-09-24: qty 1

## 2021-09-24 MED ORDER — ACETAMINOPHEN 500 MG PO TABS: 1000.0000 mg | ORAL_TABLET | Freq: Once | ORAL | Status: AC

## 2021-09-24 MED ORDER — ONDANSETRON HCL 4 MG/2ML IJ SOLN
INTRAMUSCULAR | Status: AC
  Filled 2021-09-24: qty 2

## 2021-09-24 MED ORDER — SUCCINYLCHOLINE CHLORIDE 200 MG/10ML IV SOSY
PREFILLED_SYRINGE | INTRAVENOUS | Status: AC
  Filled 2021-09-24: qty 10

## 2021-09-24 MED ORDER — LIDOCAINE 2% (20 MG/ML) 5 ML SYRINGE
INTRAMUSCULAR | Status: AC
  Filled 2021-09-24: qty 5

## 2021-09-24 MED ORDER — FENTANYL CITRATE (PF) 100 MCG/2ML IJ SOLN
INTRAMUSCULAR | Status: DC | PRN
  Administered 2021-09-24: 25 ug via INTRAVENOUS
  Administered 2021-09-24: 50 ug via INTRAVENOUS
  Administered 2021-09-24: 25 ug via INTRAVENOUS
  Administered 2021-09-24 (×2): 50 ug via INTRAVENOUS

## 2021-09-24 MED ORDER — DEXMEDETOMIDINE (PRECEDEX) IN NS 20 MCG/5ML (4 MCG/ML) IV SYRINGE
PREFILLED_SYRINGE | INTRAVENOUS | Status: AC
  Filled 2021-09-24: qty 5

## 2021-09-24 MED ORDER — CHLORHEXIDINE GLUCONATE 0.12 % MT SOLN
OROMUCOSAL | Status: AC
  Administered 2021-09-24: 15 mL via OROMUCOSAL
  Filled 2021-09-24: qty 15

## 2021-09-24 MED ORDER — CHLORHEXIDINE GLUCONATE 0.12 % MT SOLN: 15.0000 mL | Freq: Once | OROMUCOSAL | Status: AC

## 2021-09-24 MED ORDER — DEXMEDETOMIDINE (PRECEDEX) IN NS 20 MCG/5ML (4 MCG/ML) IV SYRINGE
PREFILLED_SYRINGE | INTRAVENOUS | Status: DC | PRN
  Administered 2021-09-24: 12 ug via INTRAVENOUS
  Administered 2021-09-24: 8 ug via INTRAVENOUS

## 2021-09-24 MED ORDER — BUPIVACAINE-EPINEPHRINE (PF) 0.5% -1:200000 IJ SOLN
INTRAMUSCULAR | Status: DC | PRN
  Administered 2021-09-24: 30 mL via PERINEURAL

## 2021-09-24 MED ORDER — MIDAZOLAM HCL 2 MG/2ML IJ SOLN: 2.0000 mg | Freq: Once | INTRAMUSCULAR | Status: AC

## 2021-09-24 MED ORDER — FENTANYL CITRATE (PF) 100 MCG/2ML IJ SOLN
INTRAMUSCULAR | Status: AC
  Administered 2021-09-24: 100 ug via INTRAVENOUS
  Filled 2021-09-24: qty 2

## 2021-09-24 MED ORDER — MIDAZOLAM HCL 2 MG/2ML IJ SOLN
INTRAMUSCULAR | Status: AC
  Administered 2021-09-24: 2 mg via INTRAVENOUS
  Filled 2021-09-24: qty 2

## 2021-09-24 MED ORDER — VANCOMYCIN HCL 500 MG IV SOLR
INTRAVENOUS | Status: AC
  Filled 2021-09-24: qty 10

## 2021-09-24 MED ORDER — MIDAZOLAM HCL 5 MG/5ML IJ SOLN
INTRAMUSCULAR | Status: DC | PRN
  Administered 2021-09-24: 2 mg via INTRAVENOUS

## 2021-09-24 MED ORDER — SUCCINYLCHOLINE CHLORIDE 200 MG/10ML IV SOSY
PREFILLED_SYRINGE | INTRAVENOUS | Status: DC | PRN
  Administered 2021-09-24: 100 mg via INTRAVENOUS

## 2021-09-24 MED ORDER — VANCOMYCIN HCL 500 MG IV SOLR
INTRAVENOUS | Status: DC | PRN
  Administered 2021-09-24: 500 mg

## 2021-09-24 MED ORDER — CELECOXIB 200 MG PO CAPS: 200.0000 mg | ORAL_CAPSULE | Freq: Once | ORAL | Status: AC

## 2021-09-24 MED ORDER — LIDOCAINE 2% (20 MG/ML) 5 ML SYRINGE
INTRAMUSCULAR | Status: DC | PRN
  Administered 2021-09-24: 80 mg via INTRAVENOUS

## 2021-09-24 MED ORDER — ACETAMINOPHEN 500 MG PO TABS
ORAL_TABLET | ORAL | Status: AC
  Administered 2021-09-24: 1000 mg via ORAL
  Filled 2021-09-24: qty 2

## 2021-09-24 MED ORDER — LACTATED RINGERS IV SOLN: INTRAVENOUS | Status: DC

## 2021-09-24 MED ORDER — 0.9 % SODIUM CHLORIDE (POUR BTL) OPTIME
TOPICAL | Status: DC | PRN
  Administered 2021-09-24: 1000 mL

## 2021-09-24 MED ORDER — FENTANYL CITRATE (PF) 250 MCG/5ML IJ SOLN
INTRAMUSCULAR | Status: AC
  Filled 2021-09-24: qty 5

## 2021-09-24 MED ORDER — ORAL CARE MOUTH RINSE: 15.0000 mL | Freq: Once | OROMUCOSAL | Status: AC

## 2021-09-24 MED ORDER — DEXAMETHASONE SODIUM PHOSPHATE 10 MG/ML IJ SOLN
INTRAMUSCULAR | Status: DC | PRN
  Administered 2021-09-24: 10 mg via INTRAVENOUS

## 2021-09-24 MED ORDER — MIDAZOLAM HCL 2 MG/2ML IJ SOLN
INTRAMUSCULAR | Status: AC
  Filled 2021-09-24: qty 2

## 2021-09-24 MED ORDER — CEFAZOLIN SODIUM-DEXTROSE 2-4 GM/100ML-% IV SOLN
INTRAVENOUS | Status: AC
  Filled 2021-09-24: qty 100

## 2021-09-24 MED ORDER — PROPOFOL 10 MG/ML IV BOLUS
INTRAVENOUS | Status: AC
  Filled 2021-09-24: qty 20

## 2021-09-24 MED ORDER — FENTANYL CITRATE (PF) 100 MCG/2ML IJ SOLN: 100.0000 ug | Freq: Once | INTRAMUSCULAR | Status: AC

## 2021-09-24 MED ORDER — CEFAZOLIN SODIUM-DEXTROSE 2-4 GM/100ML-% IV SOLN
2.0000 g | INTRAVENOUS | Status: AC
  Administered 2021-09-24: 2 g via INTRAVENOUS

## 2021-09-24 MED ORDER — PROPOFOL 10 MG/ML IV BOLUS
INTRAVENOUS | Status: DC | PRN
  Administered 2021-09-24: 40 mg via INTRAVENOUS
  Administered 2021-09-24: 50 mg via INTRAVENOUS
  Administered 2021-09-24: 160 mg via INTRAVENOUS

## 2021-09-24 MED ORDER — DEXAMETHASONE SODIUM PHOSPHATE 10 MG/ML IJ SOLN
INTRAMUSCULAR | Status: AC
  Filled 2021-09-24: qty 1

## 2021-09-24 SURGICAL SUPPLY — 88 items
APL PRP STRL LF DISP 70% ISPRP (MISCELLANEOUS) ×4
BAG COUNTER SPONGE SURGICOUNT (BAG) ×4 IMPLANT
BAG SPNG CNTER NS LX DISP (BAG) ×2
BANDAGE ESMARK 6X9 LF (GAUZE/BANDAGES/DRESSINGS) ×3 IMPLANT
BIT DRILL 2.4X140 LONG SOLID (BIT) ×2 IMPLANT
BIT DRILL 2.5X2.75 QC CALB (BIT) ×2 IMPLANT
BIT DRILL 2.9 CANN QC NONSTRL (BIT) ×2 IMPLANT
BIT DRILL CALIBRATED 2.7 (BIT) ×2 IMPLANT
BLADE 15 SAFETY STRL DISP (BLADE) ×8 IMPLANT
BNDG CMPR 9X6 STRL LF SNTH (GAUZE/BANDAGES/DRESSINGS) ×2
BNDG CMPR STD VLCR NS LF 5.8X4 (GAUZE/BANDAGES/DRESSINGS) ×4
BNDG COHESIVE 4X5 TAN ST LF (GAUZE/BANDAGES/DRESSINGS) ×4 IMPLANT
BNDG COHESIVE 6X5 TAN ST LF (GAUZE/BANDAGES/DRESSINGS) ×4 IMPLANT
BNDG ELASTIC 4X5.8 VLCR NS LF (GAUZE/BANDAGES/DRESSINGS) ×4 IMPLANT
BNDG ELASTIC 6X5.8 VLCR STR LF (GAUZE/BANDAGES/DRESSINGS) ×2 IMPLANT
BNDG ESMARK 6X9 LF (GAUZE/BANDAGES/DRESSINGS) ×3
BNDG GAUZE ELAST 4 BULKY (GAUZE/BANDAGES/DRESSINGS) ×6 IMPLANT
CANISTER SUCT 3000ML PPV (MISCELLANEOUS) ×4 IMPLANT
CHLORAPREP W/TINT 26 (MISCELLANEOUS) ×8 IMPLANT
COVER SURGICAL LIGHT HANDLE (MISCELLANEOUS) ×4 IMPLANT
CUFF TOURN SGL QUICK 34 (TOURNIQUET CUFF) ×3
CUFF TRNQT CYL 34X4.125X (TOURNIQUET CUFF) ×3 IMPLANT
DRAPE C-ARM 42X120 X-RAY (DRAPES) ×2 IMPLANT
DRAPE C-ARMOR (DRAPES) ×4 IMPLANT
DRAPE EXTREMITY T 121X128X90 (DISPOSABLE) ×4 IMPLANT
DRAPE ORTHO SPLIT 77X108 STRL (DRAPES) ×6
DRAPE SURG ORHT 6 SPLT 77X108 (DRAPES) ×6 IMPLANT
DRAPE U-SHAPE 47X51 STRL (DRAPES) ×4 IMPLANT
DRSG ADAPTIC 3X8 NADH LF (GAUZE/BANDAGES/DRESSINGS) ×2 IMPLANT
DRSG PAD ABDOMINAL 8X10 ST (GAUZE/BANDAGES/DRESSINGS) ×2 IMPLANT
ELECT REM PT RETURN 9FT ADLT (ELECTROSURGICAL) ×3
ELECTRODE REM PT RTRN 9FT ADLT (ELECTROSURGICAL) ×3 IMPLANT
FIXATION ZIPTIGHT ANKLE SNDSMS (Ankle) ×1 IMPLANT
GAUZE SPONGE 4X4 12PLY STRL (GAUZE/BANDAGES/DRESSINGS) ×4 IMPLANT
GAUZE XEROFORM 5X9 LF (GAUZE/BANDAGES/DRESSINGS) ×2 IMPLANT
GLOVE BIO SURGEON STRL SZ7.5 (GLOVE) ×2 IMPLANT
GLOVE BIOGEL PI IND STRL 7.5 (GLOVE) ×1 IMPLANT
GLOVE BIOGEL PI INDICATOR 7.5 (GLOVE) ×1
GLOVE SURG SS PI 7.5 STRL IVOR (GLOVE) ×6 IMPLANT
GOWN STRL REUS W/ TWL LRG LVL3 (GOWN DISPOSABLE) ×4 IMPLANT
GOWN STRL REUS W/TWL LRG LVL3 (GOWN DISPOSABLE) ×6
K-WIRE ACE 1.6X6 (WIRE) ×18
KIT BASIN OR (CUSTOM PROCEDURE TRAY) ×4 IMPLANT
KIT TURNOVER KIT A (KITS) ×2 IMPLANT
KIT TURNOVER KIT B (KITS) ×4 IMPLANT
KWIRE ACE 1.6X6 (WIRE) ×6 IMPLANT
NS IRRIG 1000ML POUR BTL (IV SOLUTION) ×4 IMPLANT
PACK ORTHO EXTREMITY (CUSTOM PROCEDURE TRAY) ×4 IMPLANT
PAD ARMBOARD 7.5X6 YLW CONV (MISCELLANEOUS) ×8 IMPLANT
PAD CAST 4YDX4 CTTN HI CHSV (CAST SUPPLIES) ×1 IMPLANT
PADDING CAST COTTON 4X4 STRL (CAST SUPPLIES) ×3
PADDING CAST COTTON 6X4 STRL (CAST SUPPLIES) ×4 IMPLANT
PLATE ACE 100DEG 3HOLE (Plate) ×4 IMPLANT
PLATE LOCK 4H 109 LT DIST FIB (Plate) ×2 IMPLANT
PLATE LOCK 7H 92 BILAT FIB (Plate) ×2 IMPLANT
PLATE MEDIAL MALLEOLUS 4H HOOK (Plate) ×2 IMPLANT
PUTTY DBM STAGRAFT PLUS 2CC (Putty) ×2 IMPLANT
SCREW 3.5X26 NONLOCKING (Screw) ×2 IMPLANT
SCREW ACE CAN 4.0 28M (Screw) ×2 IMPLANT
SCREW ACE CAN 4.0 38M (Screw) ×2 IMPLANT
SCREW CANN ACE 4.0X32 (Screw) ×2 IMPLANT
SCREW LOCK 3.5X14 DIST TIB (Screw) ×2 IMPLANT
SCREW LOCK 3.5X16 DIST TIB (Screw) ×2 IMPLANT
SCREW LOCK CORT STAR 3.5X10 (Screw) ×4 IMPLANT
SCREW LOCK CORT STAR 3.5X12 (Screw) ×2 IMPLANT
SCREW LOCK PLATE R3 3.5X26 (Screw) ×2 IMPLANT
SCREW LOW PROFILE 18MMX3.5MM (Screw) ×2 IMPLANT
SCREW NON LOCKING LP 3.5 14MM (Screw) ×2 IMPLANT
SCREW NON LOCKING LP 3.5 16MM (Screw) ×2 IMPLANT
SCREW RE3CON NL 3.5X30 (Screw) ×2 IMPLANT
SPLINT PLASTER CAST XFAST 5X30 (CAST SUPPLIES) ×2 IMPLANT
SPLINT PLASTER XFAST SET 5X30 (CAST SUPPLIES) ×2
SPONGE T-LAP 18X18 ~~LOC~~+RFID (SPONGE) ×6 IMPLANT
STAPLER VISISTAT 35W (STAPLE) ×4 IMPLANT
STOCKINETTE TUBULAR COTT 4X25 (GAUZE/BANDAGES/DRESSINGS) ×4 IMPLANT
STRIP CLOSURE SKIN 1/2X4 (GAUZE/BANDAGES/DRESSINGS) ×4 IMPLANT
SUCTION FRAZIER HANDLE 10FR (MISCELLANEOUS) ×3
SUCTION TUBE FRAZIER 10FR DISP (MISCELLANEOUS) ×3 IMPLANT
SUT ETHILON 3 0 PS 1 (SUTURE) ×10 IMPLANT
SUT VIC AB 2-0 SH 27 (SUTURE) ×6
SUT VIC AB 2-0 SH 27XBRD (SUTURE) ×2 IMPLANT
SUT VIC AB 3-0 SH 27 (SUTURE) ×3
SUT VIC AB 3-0 SH 27X BRD (SUTURE) ×1 IMPLANT
TOWEL GREEN STERILE (TOWEL DISPOSABLE) ×4 IMPLANT
TOWEL GREEN STERILE FF (TOWEL DISPOSABLE) ×4 IMPLANT
TUBE CONNECTING 12X1/4 (SUCTIONS) ×2 IMPLANT
WATER STERILE IRR 1000ML POUR (IV SOLUTION) ×4 IMPLANT
ZIPTIGHT ANKLE SYNODESMOSS FIX (Ankle) ×3 IMPLANT

## 2021-09-24 NOTE — Anesthesia Procedure Notes (Signed)
Anesthesia Regional Block: Popliteal block  ? ?Pre-Anesthetic Checklist: , timeout performed,  Correct Patient, Correct Site, Correct Laterality,  Correct Procedure, Correct Position, site marked,  Risks and benefits discussed,  Surgical consent,  Pre-op evaluation,  At surgeon's request and post-op pain management ? ?Laterality: Left ? ?Prep: chloraprep     ?  ?Needles:  ?Injection technique: Single-shot ? ?Needle Type: Echogenic Stimulator Needle   ? ? ? ? ? ? ? ?Additional Needles: ? ? ?Procedures:, nerve stimulator,,,,,    ? ?Nerve Stimulator or Paresthesia:  ?Response: plantar flexion of foot, 0.45 mA ? ?Additional Responses:  ? ?Narrative:  ?Start time: 09/24/2021 5:25 PM ?End time: 09/24/2021 5:36 PM ?Injection made incrementally with aspirations every 5 mL. ? ?Performed by: Personally  ?Anesthesiologist: Albertha Ghee, MD ? ?Additional Notes: ?Functioning IV was confirmed and monitors were applied.  A 21m 21ga Arrow echogenic stimulator needle was used. Sterile prep and drape,hand hygiene and sterile gloves were used.  Negative aspiration and negative test dose prior to incremental administration of local anesthetic. The patient tolerated the procedure well. ? Ultrasound guidance: relevent anatomy identified, needle position confirmed, local anesthetic spread visualized around nerve(s), vascular puncture avoided.  Image printed for medical record.  ? ? ? ? ?

## 2021-09-24 NOTE — Anesthesia Procedure Notes (Signed)
Procedure Name: Intubation ?Date/Time: 09/24/2021 10:04 PM ?Performed by: Reece Agar, CRNA ?Pre-anesthesia Checklist: Patient identified, Emergency Drugs available, Suction available and Patient being monitored ?Patient Re-evaluated:Patient Re-evaluated prior to induction ?Oxygen Delivery Method: Circle System Utilized ?Preoxygenation: Pre-oxygenation with 100% oxygen ?Induction Type: IV induction ?Laryngoscope Size: Mac and 3 ?Grade View: Grade I ?Tube type: Oral ?Tube size: 7.0 mm ?Number of attempts: 1 ?Airway Equipment and Method: Stylet ?Placement Confirmation: ETT inserted through vocal cords under direct vision, positive ETCO2 and breath sounds checked- equal and bilateral ?Secured at: 21 cm ?Tube secured with: Tape ?Dental Injury: Teeth and Oropharynx as per pre-operative assessment  ? ? ? ? ?

## 2021-09-24 NOTE — Anesthesia Procedure Notes (Addendum)
Procedure Name: LMA Insertion ?Date/Time: 09/24/2021 8:32 PM ?Performed by: Eligha Bridegroom, CRNA ?Pre-anesthesia Checklist: Patient identified, Emergency Drugs available, Suction available and Patient being monitored ?Patient Re-evaluated:Patient Re-evaluated prior to induction ?Oxygen Delivery Method: Circle system utilized ?Preoxygenation: Pre-oxygenation with 100% oxygen ?Induction Type: IV induction ?Ventilation: Mask ventilation without difficulty ?LMA: LMA inserted ?Placement Confirmation: positive ETCO2 and breath sounds checked- equal and bilateral ?Tube secured with: Tape ?Dental Injury: Teeth and Oropharynx as per pre-operative assessment  ?Difficulty Due To: Difficulty was unanticipated ? ? ? ? ?

## 2021-09-24 NOTE — H&P (Signed)
H&P Update: ? ?-History and Physical Reviewed ? ?-Patient has been re-examined ? ?-No change in the plan of care ? ?-The risks and benefits were presented and reviewed. The risks due to hardware failure/irritation, new/persistent infection, stiffness, nerve/vessel/tendon injury, nonunion/malunion, wound healing issues, development of arthritis, failure of this surgery, possibility of external fixation with delayed definitive surgery, need for further surgery, thromboembolic events, anesthesia/medical complications, amputation, death among others were discussed. The patient acknowledged the explanation, agreed to proceed with the plan and a consent was signed. ? ?Whitney Murphy ? ?

## 2021-09-24 NOTE — Anesthesia Procedure Notes (Deleted)
Date/Time: 09/24/2021 8:40 PM ?Performed by: Eligha Bridegroom, CRNA ? ? ? ? ?

## 2021-09-24 NOTE — Anesthesia Preprocedure Evaluation (Addendum)
Anesthesia Evaluation  ?Patient identified by MRN, date of birth, ID band ?Patient awake ? ? ? ?Reviewed: ?Allergy & Precautions, H&P , NPO status , Patient's Chart, lab work & pertinent test results ? ?History of Anesthesia Complications ?Negative for: history of anesthetic complications ? ?Airway ?Mallampati: II ? ? ?Neck ROM: full ? ? ? Dental ?  ?Pulmonary ?asthma , Current Smoker and Patient abstained from smoking.,  ?  ?breath sounds clear to auscultation ? ? ? ? ? ? Cardiovascular ?hypertension, Pt. on medications and Pt. on home beta blockers ? ?Rhythm:regular Rate:Normal ? ? ?  ?Neuro/Psych ?PSYCHIATRIC DISORDERS Anxiety Depression negative neurological ROS ?   ? GI/Hepatic ?GERD  Medicated,(+)  ?  ? substance abuse ? alcohol use and marijuana use,   ?Endo/Other  ?negative endocrine ROS ? Renal/GU ?negative Renal ROS  ? ?  ?Musculoskeletal ? ?(+) Arthritis ,  ? Abdominal ?  ?Peds ? Hematology ?negative hematology ROS ?(+)   ?Anesthesia Other Findings ? ? Reproductive/Obstetrics ? ?  ? ? ? ? ? ? ? ? ? ? ? ? ? ?  ?  ? ? ? ? ? ? ?Anesthesia Physical ?Anesthesia Plan ? ?ASA: 3 ? ?Anesthesia Plan: General  ? ?Post-op Pain Management: Tylenol PO (pre-op)*, Celebrex PO (pre-op)* and Regional block*  ? ?Induction: Intravenous ? ?PONV Risk Score and Plan: Treatment may vary due to age or medical condition, Ondansetron, Dexamethasone and Midazolam ? ?Airway Management Planned: LMA and Mask ? ?Additional Equipment: None ? ?Intra-op Plan:  ? ?Post-operative Plan: Extubation in OR ? ?Informed Consent: I have reviewed the patients History and Physical, chart, labs and discussed the procedure including the risks, benefits and alternatives for the proposed anesthesia with the patient or authorized representative who has indicated his/her understanding and acceptance.  ? ? ? ? ? ?Plan Discussed with: CRNA and Anesthesiologist ? ?Anesthesia Plan Comments:   ? ? ? ? ?Anesthesia Quick  Evaluation ? ?

## 2021-09-25 ENCOUNTER — Encounter (HOSPITAL_COMMUNITY): Payer: Self-pay | Admitting: Orthopaedic Surgery

## 2021-09-25 ENCOUNTER — Other Ambulatory Visit: Payer: Self-pay

## 2021-09-25 DIAGNOSIS — Z9889 Other specified postprocedural states: Secondary | ICD-10-CM

## 2021-09-25 MED ORDER — FENTANYL CITRATE (PF) 100 MCG/2ML IJ SOLN
25.0000 ug | INTRAMUSCULAR | Status: DC | PRN
  Administered 2021-09-25 (×4): 25 ug via INTRAVENOUS

## 2021-09-25 MED ORDER — CEFAZOLIN SODIUM-DEXTROSE 1-4 GM/50ML-% IV SOLN
1.0000 g | Freq: Four times a day (QID) | INTRAVENOUS | Status: DC
  Administered 2021-09-25: 1 g via INTRAVENOUS
  Filled 2021-09-25 (×2): qty 50

## 2021-09-25 MED ORDER — ASPIRIN 81 MG PO CHEW: 81.0000 mg | CHEWABLE_TABLET | Freq: Two times a day (BID) | ORAL | Status: DC

## 2021-09-25 MED ORDER — ACETAMINOPHEN 325 MG PO TABS: 325.0000 mg | ORAL_TABLET | Freq: Four times a day (QID) | ORAL | Status: DC | PRN

## 2021-09-25 MED ORDER — MECLIZINE HCL 25 MG PO TABS: 25.0000 mg | ORAL_TABLET | Freq: Two times a day (BID) | ORAL | Status: DC

## 2021-09-25 MED ORDER — ONDANSETRON HCL 4 MG/2ML IJ SOLN
INTRAMUSCULAR | Status: DC | PRN
Start: 2021-09-25 — End: 2021-09-25
  Administered 2021-09-25: 4 mg via INTRAVENOUS

## 2021-09-25 MED ORDER — ATORVASTATIN CALCIUM 10 MG PO TABS: 20.0000 mg | ORAL_TABLET | Freq: Every day | ORAL | Status: DC

## 2021-09-25 MED ORDER — ONDANSETRON HCL 4 MG/2ML IJ SOLN: 4.0000 mg | Freq: Four times a day (QID) | INTRAMUSCULAR | Status: DC | PRN

## 2021-09-25 MED ORDER — MIRTAZAPINE 15 MG PO TABS: 15.0000 mg | ORAL_TABLET | Freq: Every day | ORAL | Status: DC

## 2021-09-25 MED ORDER — OXYCODONE HCL 5 MG PO TABS
ORAL_TABLET | ORAL | Status: AC
  Filled 2021-09-25: qty 1

## 2021-09-25 MED ORDER — ONDANSETRON HCL 4 MG/2ML IJ SOLN: 4.0000 mg | Freq: Once | INTRAMUSCULAR | Status: DC | PRN

## 2021-09-25 MED ORDER — ACETAMINOPHEN 500 MG PO TABS: 500.0000 mg | ORAL_TABLET | Freq: Four times a day (QID) | ORAL | Status: DC

## 2021-09-25 MED ORDER — OXYCODONE HCL 5 MG PO TABS
5.0000 mg | ORAL_TABLET | Freq: Once | ORAL | Status: AC | PRN
  Administered 2021-09-25: 5 mg via ORAL

## 2021-09-25 MED ORDER — FENTANYL CITRATE (PF) 100 MCG/2ML IJ SOLN
INTRAMUSCULAR | Status: AC
  Filled 2021-09-25: qty 2

## 2021-09-25 MED ORDER — ONDANSETRON HCL 4 MG PO TABS: 4.0000 mg | ORAL_TABLET | Freq: Four times a day (QID) | ORAL | Status: DC | PRN

## 2021-09-25 MED ORDER — DOCUSATE SODIUM 100 MG PO CAPS: 100.0000 mg | ORAL_CAPSULE | Freq: Two times a day (BID) | ORAL | Status: DC

## 2021-09-25 MED ORDER — SODIUM CHLORIDE 0.9 % IV SOLN
INTRAVENOUS | Status: AC
Start: 2021-09-25 — End: 2021-09-25

## 2021-09-25 MED ORDER — MORPHINE SULFATE (PF) 2 MG/ML IV SOLN: 0.5000 mg | INTRAVENOUS | Status: DC | PRN

## 2021-09-25 MED ORDER — GABAPENTIN 300 MG PO CAPS: 300.0000 mg | ORAL_CAPSULE | Freq: Two times a day (BID) | ORAL | Status: DC

## 2021-09-25 MED ORDER — HYDROCODONE-ACETAMINOPHEN 7.5-325 MG PO TABS: 1.0000 | ORAL_TABLET | ORAL | Status: DC | PRN

## 2021-09-25 MED ORDER — SERTRALINE HCL 50 MG PO TABS: 50.0000 mg | ORAL_TABLET | Freq: Every day | ORAL | Status: DC

## 2021-09-25 MED ORDER — QUETIAPINE FUMARATE 50 MG PO TABS: 50.0000 mg | ORAL_TABLET | Freq: Every day | ORAL | Status: DC

## 2021-09-25 MED ORDER — ATENOLOL 25 MG PO TABS: 25.0000 mg | ORAL_TABLET | Freq: Every day | ORAL | Status: DC

## 2021-09-25 MED ORDER — OXYCODONE HCL 5 MG/5ML PO SOLN: 5.0000 mg | Freq: Once | ORAL | Status: AC | PRN

## 2021-09-25 MED ORDER — HYDROCODONE-ACETAMINOPHEN 5-325 MG PO TABS: 1.0000 | ORAL_TABLET | ORAL | Status: DC | PRN

## 2021-09-25 MED ORDER — METOCLOPRAMIDE HCL 5 MG PO TABS: 5.0000 mg | ORAL_TABLET | Freq: Three times a day (TID) | ORAL | Status: DC | PRN

## 2021-09-25 MED ORDER — METOCLOPRAMIDE HCL 5 MG/ML IJ SOLN: 5.0000 mg | Freq: Three times a day (TID) | INTRAMUSCULAR | Status: DC | PRN

## 2021-09-25 NOTE — Progress Notes (Signed)
Subjective: ?1 Day Post-Op Procedure(s) (LRB): ?OPEN REDUCTION INTERNAL FIXATION (ORIF) ANKLE (Left) ?SYNDESMOSIS REPAIR (Left) ? ?Patient reports pain as mild.  She did well overnight in PACU after being admitted for observation.  ? ?Objective:  ? ?VITALS:  Temp:  [97.1 ?F (36.2 ?C)-98.2 ?F (36.8 ?C)] 97.1 ?F (36.2 ?C) (04/18 0200) ?Pulse Rate:  [72-102] 102 (04/18 1004) ?Resp:  [12-20] 20 (04/18 1004) ?BP: (90-174)/(54-118) 151/86 (04/18 1004) ?SpO2:  [90 %-98 %] 96 % (04/18 1004) ?Weight:  [58.5 kg] 58.5 kg (04/17 1152) ? ?Gen: AAOx3, NAD ? ?Left lower extremity: ?Well padded short leg splint in place ?Wiggles toes ?SILT over toes ?CR<2s ? ? ? ?LABS ?None overnight ? ? ?Assessment/Plan: ?1 Day Post-Op Procedure(s) (LRB): ?OPEN REDUCTION INTERNAL FIXATION (ORIF) ANKLE (Left) ?SYNDESMOSIS REPAIR (Left) ? ?-doing well postop ?-regular diet ?-NWB LLE ?-smoking cessation counseling ?-PT/OT to eval to assist with NWB precautions ?-meds sent to her preferred pharmacy ?-discharge home today from PACU ? ?Armond Hang ?09/25/2021, 11:44 AM ? ?

## 2021-09-25 NOTE — Anesthesia Postprocedure Evaluation (Signed)
Anesthesia Post Note ? ?Patient: Whitney Murphy ? ?Procedure(s) Performed: OPEN REDUCTION INTERNAL FIXATION (ORIF) ANKLE (Left: Ankle) ?SYNDESMOSIS REPAIR (Left: Ankle) ? ?  ? ?Patient location during evaluation: PACU ?Anesthesia Type: General ?Level of consciousness: awake and alert ?Pain management: pain level controlled ?Vital Signs Assessment: post-procedure vital signs reviewed and stable ?Respiratory status: spontaneous breathing, nonlabored ventilation, respiratory function stable and patient connected to nasal cannula oxygen ?Cardiovascular status: blood pressure returned to baseline and stable ?Postop Assessment: no apparent nausea or vomiting ?Anesthetic complications: no ? ? ?No notable events documented. ? ?Last Vitals:  ?Vitals:  ? 09/25/21 0630 09/25/21 0700  ?BP: (!) 155/96 (!) 158/96  ?Pulse: 86 93  ?Resp: 17 15  ?Temp:    ?SpO2: 93% 92%  ?  ?Last Pain:  ?Vitals:  ? 09/25/21 0600  ?TempSrc:   ?PainSc: 4   ? ? ?  ?  ?  ?  ?  ?  ? ?Santa Lighter ? ? ? ? ?

## 2021-09-25 NOTE — Progress Notes (Signed)
Occupational Therapy Evaluation ? ?Educated pt on compensatory strategies for ADL, functional mobility and reducing risk of falls. Pt has all necessary DME and support for DC home. No further OT needed.  ? 09/25/21 1000  ?OT Visit Information  ?Last OT Received On 09/25/21  ?Assistance Needed +1  ?History of Present Illness Pt is a 51 y.o. F who presents after a fall with acute displaced left trimalleolar fracture with lateral and posterior dislocation of the talus with respect to the distal tibia now s/p ORIF 4/17. No significant PMH.  ?Precautions  ?Precautions Fall  ?Restrictions  ?Weight Bearing Restrictions Yes  ?LLE Weight Bearing NWB  ?Home Living  ?Family/patient expects to be discharged to: Private residence  ?Living Arrangements Spouse/significant other  ?Available Help at Discharge Family  ?Type of Home House  ?Home Access Ramped entrance  ?Home Layout One level  ?Bathroom Shower/Tub Walk-in shower  ?Bathroom Toilet Standard  ?Bathroom Accessibility Yes  ?How Accessible Accessible via wheelchair;Accessible via walker  ?Home Equipment [employer] (2 wheels);Rollator (4 wheels);Crutches;Shower seat;Hand held shower head;Wheelchair - manual;BSC/3in1;Grab bars - tub/shower ?(knee scooter)  ?Additional Comments Pt spouse is double amputee and is modI  ?Prior Function  ?Prior Level of Function  Independent/Modified Independent  ?Mobility Comments works as Presenter, broadcasting  ?Communication  ?Communication No difficulties  ?Pain Assessment  ?Faces Pain Scale 6  ?Pain Location L ankle  ?Pain Descriptors / Indicators Discomfort;Grimacing;Operative site guarding  ?Cognition  ?Arousal/Alertness Awake/alert  ?Behavior During Therapy [facility] for tasks assessed/performed  ?Overall Cognitive Status Within Functional Limits for tasks assessed  ?Upper Extremity Assessment  ?Upper Extremity Assessment Overall WFL for tasks assessed  ?Lower Extremity Assessment  ?Lower Extremity Assessment Defer to PT evaluation  ?LLE Deficits /  Details S/p ankle ORIF, still unable to wiggle toes  ?Cervical / Trunk Assessment  ?Cervical / Trunk Assessment Normal  ?ADL  ?Overall ADL's  Needs assistance/impaired  ?Functional mobility during ADLs Min guard  ?General ADL Comments Educated pt on compensatory strategies for ADL tasks adn technique for shower transfer. Discussed need to baqck up to shower bench then sit and swing legs over threshold and to not attempt to step over backwards unless pt very steady with RW.Disucssed use of wc as primary mode of mobility if needed due to endurance issues; also has knee scooter she can use.Educated on need to keep items countertop level; within reach to decrease need to stoop/reach to reduce risk of falls; recommend removing throw rugs; pt verbalized understanding.  ?Bed Mobility  ?Overal bed mobility Modified Independent ?(using exercise bacn at home to get leg in/out of bed)  ?Transfers  ?Overall transfer level Modified independent  ?Equipment used Rolling walker (2 wheels)  ?Balance  ?Overall balance assessment Needs assistance  ?Sitting-balance support Feet supported  ?Sitting balance-Leahy Scale Good  ?Standing balance support Bilateral upper extremity supported  ?Standing balance-Leahy Scale Poor  ?Standing balance comment reliant on RW  ?General Comments  ?General comments (skin integrity, edema, etc.) Educated on keeping LLE elevated as much of possible and use of ice  ?Progressive Mobility  ?What is the highest level of mobility based on the progressive mobility assessment? Level 5 (Walks with assist in room/hall) - Balance while stepping forward/back and can walk in room with assist - Complete  ?Activity Stood at bedside  ?OT Recommendation  ?Follow Up Recommendations No OT follow up  ?Assistance recommended at discharge Set up Supervision/Assistance  ?Patient can return home with the following Assistance with cooking/housework;A lot of help with bathing/dressing/bathroom;Help  with stairs or ramp for  entrance;Assist for transportation  ?Functional Status Assessent  ?(no further OT needs)  ?OT Equipment None recommended by OT  ?Acute Rehab OT Goals  ?Patient Stated Goal to have better pain control  ?OT Goal Formulation All assessment and education complete, DC therapy  ?OT Time Calculation  ?OT Start Time (ACUTE ONLY) [phone]  ?OT Stop Time (ACUTE ONLY) 0957  ?OT Time Calculation (min) 14 min  ?OT General Charges  ?$OT Visit 1 Visit  ?OT Evaluation  ?$OT Eval Low Complexity 1 Low  ?[facility], OT/L  ? ?Acute OT Clinical Specialist ?Acute Rehabilitation Services ?Pager [phone] ?Office [phone]  ?

## 2021-09-25 NOTE — Progress Notes (Signed)
Patient recovered. Waiting husband for transport home. ?

## 2021-09-25 NOTE — Evaluation (Signed)
Physical Therapy Evaluation and Discharge ?Patient Details ?Name: Whitney Murphy ?MRN: [id number] ?DOB: March 16, 1971 ?Today's Date: 09/25/2021 ? ?History of Present Illness ? Pt is a 51 y.o. F who presents after a fall with acute displaced left trimalleolar fracture with lateral and posterior dislocation of the talus with respect to the distal tibia now s/p ORIF 4/17. No significant PMH.  ?Clinical Impression ? PTA, pt lives with her spouse in a house with a ramped entrance and works as a Presenter, broadcasting. Pt presents with decreased functional mobility secondary to nonweightbearing precautions and pain. Pt demonstrates ability to transfer and hop 30 ft with a RW, without physical assist. Fatigues easily with HR up to 146 bpm. Education provided regarding precautions, appropriate DME, elevation, endurance conservation techniques, open chain exercises, car transfer technique. Pt with no further questions/concerns. Thank you for this consult.  ?   ? ?Recommendations for follow up therapy are one component of a multi-disciplinary discharge planning process, led by the attending physician.  Recommendations may be updated based on patient status, additional functional criteria and insurance authorization. ? ?Follow Up Recommendations No PT follow up ? ?  ?Assistance Recommended at Discharge PRN  ?Patient can return home with the following ? A little help with walking and/or transfers;Assistance with cooking/housework;Assist for transportation;Help with stairs or ramp for entrance ? ?  ?Equipment Recommendations None recommended by PT (pt well equipped)  ?Recommendations for Other Services ?    ?  ?Functional Status Assessment Patient has had a recent decline in their functional status and demonstrates the ability to make significant improvements in function in a reasonable and predictable amount of time.  ? ?  ?Precautions / Restrictions Precautions ?Precautions: Fall ?Restrictions ?Weight Bearing Restrictions: Yes ?LLE Weight  Bearing: Non weight bearing  ? ?  ? ?Mobility ? Bed Mobility ?Overal bed mobility: Needs Assistance ?Bed Mobility: Supine to Sit, Sit to Supine ?  ?  ?Supine to sit: Modified independent (Device/Increase time) ?Sit to supine: Min assist ?  ?General bed mobility comments: ModI for supine > sit, minA for LLE negotiation back into bed ?  ? ?Transfers ?Overall transfer level: Needs assistance ?Equipment used: Rolling walker (2 wheels) ?Transfers: Sit to/from Stand ?Sit to Stand: Modified independent (Device/Increase time) ?  ?  ?  ?  ?  ?General transfer comment: Cues for hand placement, no physical assist required ?  ? ?Ambulation/Gait ?Ambulation/Gait assistance: Min guard ?Gait Distance (Feet): 30 Feet ?Assistive device: Rolling walker (2 wheels) ?Gait Pattern/deviations: Step-to pattern ?Gait velocity: decreased ?  ?  ?General Gait Details: Hop to pattern, good adherence to weightbearing precautions, cues for rolling walker rather than picking it up and activity pacing. Pt fatigues easily, requires one standing rest break ? ?Stairs ?  ?  ?  ?  ?  ? ?Wheelchair Mobility ?  ? ?Modified Rankin (Stroke Patients Only) ?  ? ?  ? ?Balance Overall balance assessment: Needs assistance ?Sitting-balance support: Feet supported ?Sitting balance-Leahy Scale: Good ?  ?  ?Standing balance support: Bilateral upper extremity supported ?Standing balance-Leahy Scale: Poor ?Standing balance comment: reliant on RW ?  ?  ?  ?  ?  ?  ?  ?  ?  ?  ?  ?   ? ? ? ?Pertinent Vitals/Pain Pain Assessment ?Pain Assessment: Faces ?Faces Pain Scale: Hurts even more ?Pain Location: L ankle ?Pain Descriptors / Indicators: Discomfort, Grimacing, Operative site guarding ?Pain Intervention(s): Limited activity within patient's tolerance, Monitored during session  ? ? ?Home Living  Family/patient expects to be discharged to:: Private residence ?Living Arrangements: Spouse/significant other ?Available Help at Discharge: Family ?Type of Home: House ?Home  Access: Ramped entrance ?  ?  ?  ?Home Layout: One level ?Home Equipment: Conservation officer, nature (2 wheels);Rollator (4 wheels);Crutches;Shower seat;Hand held shower head;Wheelchair - manual ?Additional Comments: Pt spouse is double amputee and is modI  ?  ?Prior Function Prior Level of Function : Independent/Modified Independent ?  ?  ?  ?  ?  ?  ?Mobility Comments: works as Presenter, broadcasting ?  ?  ? ? ?Hand Dominance  ?   ? ?  ?Extremity/Trunk Assessment  ? Upper Extremity Assessment ?Upper Extremity Assessment: Overall WFL for tasks assessed ?  ? ?Lower Extremity Assessment ?Lower Extremity Assessment: LLE deficits/detail ?LLE Deficits / Details: S/p ankle ORIF, still unable to wiggle toes ?  ? ?Cervical / Trunk Assessment ?Cervical / Trunk Assessment: Normal  ?Communication  ? Communication: No difficulties  ?Cognition Arousal/Alertness: Awake/alert ?Behavior During Therapy: [facility] for tasks assessed/performed ?Overall Cognitive Status: Within Functional Limits for tasks assessed ?  ?  ?  ?  ?  ?  ?  ?  ?  ?  ?  ?  ?  ?  ?  ?  ?  ?  ?  ? ?  ?General Comments   ? ?  ?Exercises    ? ?Assessment/Plan  ?  ?PT Assessment Patient does not need any further PT services  ?PT Problem List   ? ?   ?  ?PT Treatment Interventions     ? ?PT Goals (Current goals can be found in the Care Plan section)  ?Acute Rehab PT Goals ?Patient Stated Goal: less pain ?PT Goal Formulation: All assessment and education complete, DC therapy ? ?  ?Frequency   ?  ? ? ?Co-evaluation   ?  ?  ?  ?  ? ? ?  ?AM-PAC PT "6 Clicks" Mobility  ?Outcome Measure Help needed turning from your back to your side while in a flat bed without using bedrails?: None ?Help needed moving from lying on your back to sitting on the side of a flat bed without using bedrails?: None ?Help needed moving to and from a bed to a chair (including a wheelchair)?: A Little ?Help needed standing up from a chair using your arms (e.g., wheelchair or bedside chair)?: None ?Help needed to walk in  hospital room?: A Little ?Help needed climbing 3-5 steps with a railing? : A Lot ?6 Click Score: 20 ? ?  ?End of Session Equipment Utilized During Treatment: Gait belt ?Activity Tolerance: Patient tolerated treatment well ?Patient left: in bed;with call bell/phone within reach ?Nurse Communication: Mobility status ?PT Visit Diagnosis: Difficulty in walking, not elsewhere classified (R26.2);Pain ?Pain - Right/Left: Left ?Pain - part of body: Ankle and joints of foot ?  ? ?Time: 6294-7654 ?PT Time Calculation (min) (ACUTE ONLY): 31 min ? ? ?Charges:   PT Evaluation ?$PT Eval Low Complexity: 1 Low ?PT Treatments ?$Gait Training: 8-22 mins ?  ?   ? ? ?Wyona Almas, PT, DPT ?Acute Rehabilitation Services ?Pager [phone] ?Office [phone] ? ? ?Whitney Murphy ?09/25/2021, 9:49 AM ? ?

## 2021-09-25 NOTE — Transfer of Care (Signed)
Immediate Anesthesia Transfer of Care Note ? ?Patient: Whitney Murphy ? ?Procedure(s) Performed: OPEN REDUCTION INTERNAL FIXATION (ORIF) ANKLE (Left: Ankle) ?SYNDESMOSIS REPAIR (Left: Ankle) ? ?Patient Location: PACU ? ?Anesthesia Type:General ? ?Level of Consciousness: awake and drowsy ? ?Airway & Oxygen Therapy: Patient Spontanous Breathing and Patient connected to face mask oxygen ? ?Post-op Assessment: Report given to RN and Post -op Vital signs reviewed and stable ? ?Post vital signs: Reviewed and stable ? ?Last Vitals:  ?Vitals Value Taken Time  ?BP 174/109 09/25/21 0115  ?Temp 36.4 ?C 09/25/21 0115  ?Pulse 96 09/25/21 0124  ?Resp 18 09/25/21 0124  ?SpO2 95 % 09/25/21 0124  ?Vitals shown include unvalidated device data. ? ?Last Pain:  ?Vitals:  ? 09/24/21 1735  ?TempSrc:   ?PainSc: 0-No pain  ?   ? ?Patients Stated Pain Goal: 3 (09/24/21 1203) ? ?Complications: No notable events documented. ?

## 2021-09-26 NOTE — Op Note (Addendum)
09/24/2021 - 09/25/2021 ? ?6:44 PM ? ? ?PATIENT: Whitney Murphy  51 y.o. female ? ?MRN: [id number] ? ? ?PRE-OPERATIVE DIAGNOSIS:   ?Closed left trimalleolar ankle fracture-dislocation ?Current daily cigarette smoker ? ?POST-OPERATIVE DIAGNOSIS:   ?Same ? ? ?PROCEDURE: ?Left trimalleolar ankle fracture open reduction internal fixation with fixation of the posterior malleolus ?Left ankle open syndesmosis open reduction internal fixation ? ? ?SURGEON:  Armond Hang, MD ? ? ?ASSISTANT: None ? ? ?ANESTHESIA: General, regional ? ? ?EBL: Minimal ? ? ?TOURNIQUET:   ? ?Total Tourniquet Time Documented: ?Thigh (Left) - 160 minutes ?Total: Thigh (Left) - 160 minutes ? ? ? ?COMPLICATIONS: None apparent ? ? ?DISPOSITION: Extubated, awake and stable to recovery. ? ? ?INDICATION FOR PROCEDURE: ?The patient presented with above injury following fall from a ladder. She was closed reduced in the ER and splinted. She followed up as outpatient in our office for further management. 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 She has been weightbearing on her splint since ER reduction and on day of surgery as per report and her soiled splint. She also reports smoking on morning of surgery. We reviewed the risk of nicotine as well as premature weightbearing with this severe injury. ? ?We discussed the diagnosis, alternative treatment options, risks and benefits of the above surgical intervention, as well as alternative non-operative treatments. All questions/concerns were addressed and the patient/family demonstrated appropriate understanding of the diagnosis, the procedure, the postoperative course, and overall prognosis. The patient wished to proceed with surgical intervention and signed an informed surgical consent as such, in each others presence prior to surgery. ? ? ?PROCEDURE IN DETAIL: ?After preoperative consent was obtained and the correct operative site was identified, the patient was brought to  the operating room supine on stretcher and transferred onto operating table. General anesthesia was induced. Preoperative antibiotics were administered. Surgical timeout was taken. The patient was then positioned supine with an ipsilateral hip bump. The operative lower extremity was prepped and draped in standard sterile fashion with a tourniquet around the thigh. The extremity was exsanguinated and the tourniquet was inflated to 275 mmHg. ? ?Intraoperative fluoroscopy demonstrated gross posterolateral instability of the trimalleolar ankle fracture. ? ?A standard posterolateral approach was made to the lateral distal leg. Dissection was carried down to the level of the fibula and the fracture site identified. The superficial peroneal nerve was identified and protected throughout the procedure. The fibula was noted to be shortened with interposed periosteum. The fibula was brought out to length. The fibula fracture was debrided and the edges defined to achieve cortical read. Reduction maneuver was performed using pointed reduction forceps and lobster forceps. In this manner, the fibula length was restored and fracture reduced. A lag screw was not placed given the orientation of fracture lines and extensive comminution.  ? ?Due to gross instability of the ankle, multiple Kirschner wires were used to hold reduction and maintain ankle congruency while proceeding with fixation. ? ?We began with fixation of the posterior malleolus. We confirmed appropriate reduction using intraoperative fluoroscopy. The posterior malleolus was approached using window between the peroneal tendons and the Achilles tendon. The flexor hallucis longus muscle belly was elevated off the posterior tibia to access the posterior malleolus. A Kirschner wire was used to provisionally fix the fracture. A tubular plate was precontoured and placed over this wire. This wire was then overdrilled with cannulated drill and a 4.0 partially threaded screw was  implanted. This was noted to achieve  compression across the fracture with excellent congruency of the tibial articular surface on fluoroscopy. We then placed a second 4.0 partially threaded cannulated screw proximal to this to further secure the plate. Position of both screws was verified carefully using intraoperative fluoroscopy throughout. ? ?We then turned our attention to the distal fibula. Due to poor bone quality and extensive comminution at the fracture site, it was decided to use a composite locking distal fibula plate. We then selected a Zimmer locking plate to match the anatomy of the distal fibula and placed it laterally. This was implanted under intraoperative fluoroscopy with a combination of distal locking screws and proximal cortical & locking screws. ? ?We then turned to the medial malleolar fracture. After obtaining reduction under fluoroscopy, a Kirschner wire was placed to secure this reduction and to serve as guide for a cannulated screw. We then made an incision around the wire and overdrilled this with a cannulated drill. We then placed a 4.0 mm Zimmer Biomet partially threaded lag screw. This screw was noted to achieve excellent compression across the fracture site and also have excellent purchase. We verified position of the screw and fracture reduction in all planes with fluoroscopy. ? ?We then made a direct medial ankle approach and extended this proximally in anticipation of implanting a hook plate. Dissection was carried down to the level of the medial malleolar fragment. A dental pick and freer elevator were used to reduce the medial malleolar fragment. Of note, there was extensive comminution of this fragment into multiple segments, all of which had very poor bone quality. Interposed periosteum and soft tissues were debrided and the fracture reduced anatomically, being provisionally held by Kirschner wires. A Paragon28 medial distal tibia hook plate was utilized to fix the reduced  medial malleolar fragment and the tines were carefully inserted into the distal tip of the malleolus. The plate was applied to best capture the major fragments of the medial comminution. We placed a non-locking screw in the hook plate to secure it to distal tibia. We then implanted a locking screw to further secure the plate. ? ?A manual external rotation stress radiograph was obtained and demonstrated widening of the ankle mortise. Given this intraoperative finding as well as pre/intraoperative dislocation, it was decided to reduce and fix the syndesmosis. Therefore a suture fixation system (Zimmer ZipTight device) was implanted through the fibula plate in cannulated fashion to fix the syndesmosis. Anchor/button position was verified along anteromedial tibial cortex by fluoroscopy. A repeat stress radiograph showed complete stability of the ankle mortise to testing. ? ?The surgical sites were thoroughly irrigated. The tourniquet was deflated and hemostasis achieved. The deep layers were closed using 2-0 vicryl and the subcutaneous tissues closed using 3-0 vicryl. The skin was closed without tension using 3-0 nylon suture.  ?  ?The leg was cleaned with saline and sterile xeroform dressings with gauze were applied. A well padded bulky short leg splint was applied. The patient was awakened from anesthesia and transported to the recovery room in stable condition.  ? ? ?FOLLOW UP PLAN: ?-transfer to PACU, then RNF with planned discharge POD1 for pain management and as her husband is in dialysis ?-strict NWB operative extremity, maximum elevation ?-maintain short leg splint until follow up ?-DVT Ppx: Aspirin 81 mg twice daily while NWB ?-smoking cessation counseling ?-follow up as outpatient in 7-10 days for wound check ?-sutures out in 2-3 weeks with exchange of short leg splint to short leg cast in outpatient office ? ? ?RADIOGRAPHS: ?AP, lateral,  oblique and stress radiographs of the left ankle were obtained  intraoperatively. These showed interval reduction and fixation of the fractures. Manual stress radiographs were taken and the ankle mortise and tibiofibular relationship were noted to be stable following fixation.

## 2021-09-26 NOTE — Discharge Summary (Addendum)
Physician Discharge Summary  ?Patient ID: ?Whitney Murphy ?MRN: [id number] ?DOB/AGE: April 23, 1971 51 y.o. ? ?Admit date: 09/24/2021 ?Discharge date: 09/25/2021 ? ?Admission Diagnoses: Left trimalleolar ankle fracture dislocation ? ?Discharge Diagnoses:  ?Left trimalleolar ankle fracture dislocation ?Principal Problem: ?  Status post surgery ?Active Problems: ?  Postoperative state ? ? ?Discharged Condition: good ? ?Hospital Course: The patient underwent surgical fixation of left trimalleolar ankle fracture dislocation on 09/24/21. She was admitted for pain control and mobilization postop. She did well with no issues. She was discharged without event on 09/25/21 to home. ? ?Consults: None ? ?Significant Diagnostic Studies: None ? ?Treatments: surgery: Left trimalleolar ankle ORIF with syndesmosis fixation ? ?Discharge Exam: ?Blood pressure 137/78, pulse 83, temperature (!) 97.2 ?F (36.2 ?C), resp. rate 17, height '5\' 4"'$  (1.626 m), weight 58.5 kg, last menstrual period 03/22/2016, SpO2 93 %. ?Gen: AAOx3, NAD ? ?Left lower extremity: ?Well padded short leg splint in place ?Wiggles toes ?SILT over toes ?CR<2s ? ? ?Disposition: Discharge disposition: 01-Home or Self Care ? ? ? ? ? ? ? ?Allergies as of 09/25/2021   ?No Known Allergies ?  ? ?  ?Medication List  ?  ? ?ASK your doctor about these medications   ? ?aspirin 325 MG tablet ?Take 325 mg by mouth 2 (two) times daily. ?  ?atenolol 25 MG tablet ?Commonly known as: TENORMIN ?Take 25 mg by mouth daily. ?  ?atorvastatin 20 MG tablet ?Commonly known as: LIPITOR ?Take 1 tablet by mouth at bedtime. ?  ?diclofenac Sodium 1 % Gel ?Commonly known as: VOLTAREN ?Apply 2 g topically 3 (three) times a week. ?  ?docusate sodium 100 MG capsule ?Commonly known as: COLACE ?Take 100 mg by mouth 2 (two) times daily. ?  ?ibuprofen 800 MG tablet ?Commonly known as: ADVIL ?Take 800 mg by mouth 3 (three) times daily. ?  ?meclizine 25 MG tablet ?Commonly known as: ANTIVERT ?Take 25 mg by mouth 2  (two) times daily. ?  ?mirtazapine 15 MG tablet ?Commonly known as: REMERON ?Take 15 mg by mouth at bedtime. ?  ?Neurontin 300 MG capsule ?Generic drug: gabapentin ?Take 300 mg by mouth 2 (two) times daily. ?  ?omeprazole 20 MG capsule ?Commonly known as: PRILOSEC ?Take 20 mg by mouth daily. ?  ?ondansetron 8 MG tablet ?Commonly known as: ZOFRAN ?Take 8 mg by mouth every 8 (eight) hours as needed for nausea/vomiting. ?  ?oxyCODONE 5 MG immediate release tablet ?Commonly known as: Oxy IR/ROXICODONE ?Take 5 mg by mouth every 8 (eight) hours. ?  ?SEROquel 50 MG tablet ?Generic drug: QUEtiapine ?Take 50 mg by mouth at bedtime. ?  ?sertraline 50 MG tablet ?Commonly known as: ZOLOFT ?Take 50 mg by mouth daily. ?  ?TURMERIC PO ?Take 1 tablet by mouth daily. ?  ? ?  ? ? ? ?Signed: ?Armond Hang ?09/26/2021, 11:15 PM ? ? ?

## 2021-10-15 ENCOUNTER — Telehealth: Payer: Self-pay | Admitting: Neurology

## 2021-10-15 NOTE — Telephone Encounter (Signed)
LVM and sent mychart message asking pt to cb and r/s 5/15 appt- Dr. Leonie Man out. ?

## 2021-10-22 ENCOUNTER — Ambulatory Visit: Payer: Self-pay | Admitting: Neurology

## 2022-12-31 ENCOUNTER — Other Ambulatory Visit: Payer: Self-pay

## 2022-12-31 ENCOUNTER — Emergency Department (HOSPITAL_COMMUNITY): Payer: Self-pay

## 2022-12-31 ENCOUNTER — Emergency Department (HOSPITAL_COMMUNITY)
Admission: EM | Admit: 2022-12-31 | Discharge: 2022-12-31 | Disposition: A | Discharge status: Home / Self Care | Payer: Self-pay | Attending: Emergency Medicine | Admitting: Emergency Medicine

## 2022-12-31 DIAGNOSIS — S2231XA Fracture of one rib, right side, initial encounter for closed fracture: Secondary | ICD-10-CM | POA: Insufficient documentation

## 2022-12-31 DIAGNOSIS — Z7982 Long term (current) use of aspirin: Secondary | ICD-10-CM | POA: Insufficient documentation

## 2022-12-31 DIAGNOSIS — S40012A Contusion of left shoulder, initial encounter: Secondary | ICD-10-CM | POA: Insufficient documentation

## 2022-12-31 DIAGNOSIS — W1789XA Other fall from one level to another, initial encounter: Secondary | ICD-10-CM | POA: Insufficient documentation

## 2022-12-31 MED ORDER — OXYCODONE-ACETAMINOPHEN 5-325 MG PO TABS: 1.0000 | ORAL_TABLET | Freq: Four times a day (QID) | ORAL | 0 refills | Status: DC | PRN

## 2022-12-31 MED ORDER — OXYCODONE-ACETAMINOPHEN 5-325 MG PO TABS
2.0000 | ORAL_TABLET | Freq: Once | ORAL | Status: AC
  Administered 2022-12-31: 2 via ORAL
  Filled 2022-12-31: qty 2

## 2022-12-31 MED ORDER — ONDANSETRON 4 MG PO TBDP
4.0000 mg | ORAL_TABLET | Freq: Once | ORAL | Status: AC
  Administered 2022-12-31: 4 mg via ORAL
  Filled 2022-12-31: qty 1
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
## 2022-12-31 NOTE — Discharge Instructions (Addendum)
Your imaging has returned.  You do have a broken rib.  This takes approximately 6 weeks to heal. Take the narcotic pain medication I have prescribed as needed for severe pain.  You may also use over-the-counter lidocaine patches on the rib.  You may take Motrin or ibuprofen but do not take extra Tylenol as this is already in the Percocet prescription I have given you.  Please understand that this will take a long time to heal and if you continue to need pain medication you will need to consult with your primary care physician therefore please schedule apartment as soon as possible.  Use the incentive spirometer I have provided   Every 1-2 hours   Contact a health care provider if: You have a fever. Get help right away if: You have difficulty breathing or you are short of breath. You develop a cough that does not stop, or you cough up thick or bloody sputum. You have nausea, vomiting, or pain in your abdomen. Your pain gets worse and medicine does not help. These symptoms may represent a serious problem that is an emergency. Do not wait to see if the symptoms will go away. Get medical help right away. Call your local emergency services (911 in the [place] Do not drive yourself to the hospital.
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
## 2023-10-10 ENCOUNTER — Other Ambulatory Visit: Payer: Self-pay

## 2023-10-10 ENCOUNTER — Encounter (HOSPITAL_COMMUNITY): Payer: Self-pay

## 2023-10-10 ENCOUNTER — Inpatient Hospital Stay (HOSPITAL_COMMUNITY)

## 2023-10-10 ENCOUNTER — Emergency Department (HOSPITAL_COMMUNITY)

## 2023-10-10 ENCOUNTER — Inpatient Hospital Stay (HOSPITAL_COMMUNITY)
Admission: EM | Admit: 2023-10-10 | Discharge: 2023-10-17 | DRG: 438 | Disposition: A | Discharge status: Home / Self Care | Attending: Family Medicine | Admitting: Family Medicine

## 2023-10-10 DIAGNOSIS — Z681 Body mass index (BMI) 19 or less, adult: Secondary | ICD-10-CM | POA: Diagnosis not present

## 2023-10-10 DIAGNOSIS — Z8541 Personal history of malignant neoplasm of cervix uteri: Secondary | ICD-10-CM

## 2023-10-10 DIAGNOSIS — G9341 Metabolic encephalopathy: Secondary | ICD-10-CM | POA: Diagnosis not present

## 2023-10-10 DIAGNOSIS — K8591 Acute pancreatitis with uninfected necrosis, unspecified: Secondary | ICD-10-CM | POA: Diagnosis not present

## 2023-10-10 DIAGNOSIS — E876 Hypokalemia: Secondary | ICD-10-CM | POA: Diagnosis present

## 2023-10-10 DIAGNOSIS — F419 Anxiety disorder, unspecified: Secondary | ICD-10-CM | POA: Diagnosis present

## 2023-10-10 DIAGNOSIS — E871 Hypo-osmolality and hyponatremia: Secondary | ICD-10-CM | POA: Diagnosis present

## 2023-10-10 DIAGNOSIS — J45909 Unspecified asthma, uncomplicated: Secondary | ICD-10-CM | POA: Diagnosis present

## 2023-10-10 DIAGNOSIS — E781 Pure hyperglyceridemia: Secondary | ICD-10-CM | POA: Diagnosis present

## 2023-10-10 DIAGNOSIS — F17210 Nicotine dependence, cigarettes, uncomplicated: Secondary | ICD-10-CM | POA: Diagnosis present

## 2023-10-10 DIAGNOSIS — Z7982 Long term (current) use of aspirin: Secondary | ICD-10-CM

## 2023-10-10 DIAGNOSIS — N179 Acute kidney failure, unspecified: Secondary | ICD-10-CM | POA: Diagnosis present

## 2023-10-10 DIAGNOSIS — E872 Acidosis, unspecified: Secondary | ICD-10-CM | POA: Diagnosis present

## 2023-10-10 DIAGNOSIS — R7989 Other specified abnormal findings of blood chemistry: Secondary | ICD-10-CM | POA: Diagnosis present

## 2023-10-10 DIAGNOSIS — K863 Pseudocyst of pancreas: Secondary | ICD-10-CM | POA: Diagnosis present

## 2023-10-10 DIAGNOSIS — D7589 Other specified diseases of blood and blood-forming organs: Secondary | ICD-10-CM | POA: Diagnosis present

## 2023-10-10 DIAGNOSIS — A419 Sepsis, unspecified organism: Secondary | ICD-10-CM | POA: Diagnosis not present

## 2023-10-10 DIAGNOSIS — K7030 Alcoholic cirrhosis of liver without ascites: Secondary | ICD-10-CM | POA: Diagnosis present

## 2023-10-10 DIAGNOSIS — I10 Essential (primary) hypertension: Secondary | ICD-10-CM | POA: Diagnosis present

## 2023-10-10 DIAGNOSIS — D72829 Elevated white blood cell count, unspecified: Secondary | ICD-10-CM | POA: Diagnosis present

## 2023-10-10 DIAGNOSIS — R6510 Systemic inflammatory response syndrome (SIRS) of non-infectious origin without acute organ dysfunction: Secondary | ICD-10-CM | POA: Diagnosis present

## 2023-10-10 DIAGNOSIS — R112 Nausea with vomiting, unspecified: Secondary | ICD-10-CM | POA: Diagnosis present

## 2023-10-10 DIAGNOSIS — Z8249 Family history of ischemic heart disease and other diseases of the circulatory system: Secondary | ICD-10-CM

## 2023-10-10 DIAGNOSIS — K852 Alcohol induced acute pancreatitis without necrosis or infection: Principal | ICD-10-CM | POA: Diagnosis present

## 2023-10-10 DIAGNOSIS — K8521 Alcohol induced acute pancreatitis with uninfected necrosis: Principal | ICD-10-CM | POA: Diagnosis present

## 2023-10-10 DIAGNOSIS — F10131 Alcohol abuse with withdrawal delirium: Secondary | ICD-10-CM | POA: Diagnosis not present

## 2023-10-10 DIAGNOSIS — Z720 Tobacco use: Secondary | ICD-10-CM | POA: Diagnosis present

## 2023-10-10 DIAGNOSIS — E440 Moderate protein-calorie malnutrition: Secondary | ICD-10-CM | POA: Diagnosis present

## 2023-10-10 DIAGNOSIS — Z79899 Other long term (current) drug therapy: Secondary | ICD-10-CM

## 2023-10-10 DIAGNOSIS — R109 Unspecified abdominal pain: Secondary | ICD-10-CM | POA: Diagnosis present

## 2023-10-10 DIAGNOSIS — K7010 Alcoholic hepatitis without ascites: Secondary | ICD-10-CM | POA: Diagnosis present

## 2023-10-10 DIAGNOSIS — R9431 Abnormal electrocardiogram [ECG] [EKG]: Secondary | ICD-10-CM | POA: Diagnosis present

## 2023-10-10 DIAGNOSIS — Z87442 Personal history of urinary calculi: Secondary | ICD-10-CM

## 2023-10-10 DIAGNOSIS — F32A Depression, unspecified: Secondary | ICD-10-CM | POA: Diagnosis present

## 2023-10-10 DIAGNOSIS — D696 Thrombocytopenia, unspecified: Secondary | ICD-10-CM | POA: Diagnosis present

## 2023-10-10 DIAGNOSIS — K219 Gastro-esophageal reflux disease without esophagitis: Secondary | ICD-10-CM | POA: Diagnosis present

## 2023-10-10 DIAGNOSIS — F1010 Alcohol abuse, uncomplicated: Secondary | ICD-10-CM | POA: Diagnosis present

## 2023-10-10 LAB — COMPREHENSIVE METABOLIC PANEL WITH GFR
ALT: 99 U/L — ABNORMAL HIGH (ref 0–44)
AST: 280 U/L — ABNORMAL HIGH (ref 15–41)
Albumin: 2.6 g/dL — ABNORMAL LOW (ref 3.5–5.0)
Alkaline Phosphatase: 162 U/L — ABNORMAL HIGH (ref 38–126)
Anion gap: 9 (ref 5–15)
BUN: 21 mg/dL — ABNORMAL HIGH (ref 6–20)
CO2: 22 mmol/L (ref 22–32)
Calcium: 7.5 mg/dL — ABNORMAL LOW (ref 8.9–10.3)
Chloride: 99 mmol/L (ref 98–111)
Creatinine, Ser: 1.54 mg/dL — ABNORMAL HIGH (ref 0.44–1.00)
GFR, Estimated: 40 mL/min — ABNORMAL LOW (ref >60)
Glucose, Bld: 102 mg/dL — ABNORMAL HIGH (ref 70–99)
Potassium: 2.7 mmol/L — CL (ref 3.5–5.1)
Sodium: 130 mmol/L — ABNORMAL LOW (ref 135–145)
Total Bilirubin: 2.1 mg/dL — ABNORMAL HIGH (ref 0.0–1.2)
Total Protein: 6 g/dL — ABNORMAL LOW (ref 6.5–8.1)

## 2023-10-10 LAB — RAPID URINE DRUG SCREEN, HOSP PERFORMED
Amphetamines: NOT DETECTED
Barbiturates: NOT DETECTED
Benzodiazepines: NOT DETECTED
Cocaine: NOT DETECTED
Opiates: NOT DETECTED
Tetrahydrocannabinol: POSITIVE — AB

## 2023-10-10 LAB — CBC WITH DIFFERENTIAL/PLATELET
Abs Immature Granulocytes: 0.22 10*3/uL — ABNORMAL HIGH (ref 0.00–0.07)
Basophils Absolute: 0.1 10*3/uL (ref 0.0–0.1)
Basophils Relative: 0 %
Eosinophils Absolute: 0 10*3/uL (ref 0.0–0.5)
Eosinophils Relative: 0 %
HCT: 37.7 % (ref 36.0–46.0)
Hemoglobin: 12.5 g/dL (ref 12.0–15.0)
Immature Granulocytes: 1 %
Lymphocytes Relative: 10 %
Lymphs Abs: 1.8 10*3/uL (ref 0.7–4.0)
MCH: 34.3 pg — ABNORMAL HIGH (ref 26.0–34.0)
MCHC: 33.2 g/dL (ref 30.0–36.0)
MCV: 103.6 fL — ABNORMAL HIGH (ref 80.0–100.0)
Monocytes Absolute: 1 10*3/uL (ref 0.1–1.0)
Monocytes Relative: 5 %
Neutro Abs: 16.4 10*3/uL — ABNORMAL HIGH (ref 1.7–7.7)
Neutrophils Relative %: 84 %
Platelets: 146 10*3/uL — ABNORMAL LOW (ref 150–400)
RBC: 3.64 MIL/uL — ABNORMAL LOW (ref 3.87–5.11)
RDW: 13.1 % (ref 11.5–15.5)
WBC: 19.5 10*3/uL — ABNORMAL HIGH (ref 4.0–10.5)
nRBC: 0.1 % (ref 0.0–0.2)

## 2023-10-10 LAB — APTT: aPTT: 26 s (ref 24–36)

## 2023-10-10 LAB — I-STAT CG4 LACTIC ACID, ED
Lactic Acid, Venous: 2.5 mmol/L (ref 0.5–1.9)
Lactic Acid, Venous: 1.6 mmol/L (ref 0.5–1.9)

## 2023-10-10 LAB — LIPASE, BLOOD: Lipase: 159 U/L — ABNORMAL HIGH (ref 11–51)

## 2023-10-10 LAB — PROTIME-INR
INR: 1 (ref 0.8–1.2)
Prothrombin Time: 13.6 s (ref 11.4–15.2)

## 2023-10-10 LAB — MAGNESIUM: Magnesium: 1.8 mg/dL (ref 1.7–2.4)

## 2023-10-10 LAB — PHOSPHORUS: Phosphorus: 2.1 mg/dL — ABNORMAL LOW (ref 2.5–4.6)

## 2023-10-10 LAB — RESP PANEL BY RT-PCR (RSV, FLU A&B, COVID)  RVPGX2
Influenza A by PCR: NEGATIVE
Influenza B by PCR: NEGATIVE
Resp Syncytial Virus by PCR: NEGATIVE
SARS Coronavirus 2 by RT PCR: NEGATIVE

## 2023-10-10 LAB — ETHANOL: Alcohol, Ethyl (B): <15 mg/dL (ref <15)

## 2023-10-10 MED ORDER — LACTATED RINGERS IV SOLN: 150.0000 mL/h | INTRAVENOUS | Status: DC

## 2023-10-10 MED ORDER — SODIUM CHLORIDE (PF) 0.9 % IJ SOLN
INTRAMUSCULAR | Status: AC
  Filled 2023-10-10: qty 50

## 2023-10-10 MED ORDER — METRONIDAZOLE 500 MG/100ML IV SOLN
500.0000 mg | Freq: Once | INTRAVENOUS | Status: AC
  Administered 2023-10-10: 500 mg via INTRAVENOUS
  Filled 2023-10-10: qty 100

## 2023-10-10 MED ORDER — ONDANSETRON HCL 4 MG/2ML IJ SOLN: 4.0000 mg | Freq: Four times a day (QID) | INTRAMUSCULAR | Status: DC | PRN

## 2023-10-10 MED ORDER — FENTANYL CITRATE PF 50 MCG/ML IJ SOSY
25.0000 ug | PREFILLED_SYRINGE | INTRAMUSCULAR | Status: DC | PRN
  Administered 2023-10-10: 25 ug via INTRAVENOUS
  Filled 2023-10-10: qty 1

## 2023-10-10 MED ORDER — IOHEXOL 300 MG/ML  SOLN
100.0000 mL | Freq: Once | INTRAMUSCULAR | Status: AC | PRN
  Administered 2023-10-10: 100 mL via INTRAVENOUS

## 2023-10-10 MED ORDER — METRONIDAZOLE 500 MG/100ML IV SOLN
500.0000 mg | Freq: Two times a day (BID) | INTRAVENOUS | Status: DC
  Administered 2023-10-11 – 2023-10-12 (×3): 500 mg via INTRAVENOUS
  Filled 2023-10-10 (×4): qty 100

## 2023-10-10 MED ORDER — THIAMINE HCL 100 MG/ML IJ SOLN
500.0000 mg | Freq: Every day | INTRAMUSCULAR | Status: DC
  Administered 2023-10-10 – 2023-10-14 (×5): 500 mg via INTRAVENOUS
  Filled 2023-10-10 (×5): qty 6

## 2023-10-10 MED ORDER — LORAZEPAM 1 MG PO TABS
0.0000 mg | ORAL_TABLET | Freq: Two times a day (BID) | ORAL | Status: AC
Start: 2023-10-12 — End: 2023-10-14
  Administered 2023-10-13: 2 mg via ORAL
  Filled 2023-10-10: qty 2

## 2023-10-10 MED ORDER — LACTATED RINGERS IV SOLN: INTRAVENOUS | Status: AC

## 2023-10-10 MED ORDER — LORAZEPAM 1 MG PO TABS: 1.0000 mg | ORAL_TABLET | ORAL | Status: AC | PRN

## 2023-10-10 MED ORDER — SODIUM CHLORIDE 0.9 % IV SOLN
2.0000 g | Freq: Once | INTRAVENOUS | Status: AC
  Administered 2023-10-10: 2 g via INTRAVENOUS
  Filled 2023-10-10: qty 12.5

## 2023-10-10 MED ORDER — LORAZEPAM 2 MG/ML IJ SOLN
1.0000 mg | INTRAMUSCULAR | Status: AC | PRN
  Administered 2023-10-10 (×2): 2 mg via INTRAVENOUS
  Administered 2023-10-11: 3 mg via INTRAVENOUS
  Administered 2023-10-11 (×2): 2 mg via INTRAVENOUS
  Administered 2023-10-11: 4 mg via INTRAVENOUS
  Administered 2023-10-11 – 2023-10-12 (×5): 2 mg via INTRAVENOUS
  Filled 2023-10-10 (×3): qty 1
  Filled 2023-10-10: qty 2
  Filled 2023-10-10 (×6): qty 1
  Filled 2023-10-10: qty 2

## 2023-10-10 MED ORDER — ONDANSETRON HCL 4 MG PO TABS: 4.0000 mg | ORAL_TABLET | Freq: Four times a day (QID) | ORAL | Status: DC | PRN

## 2023-10-10 MED ORDER — THIAMINE HCL 100 MG/ML IJ SOLN: 100.0000 mg | Freq: Every day | INTRAMUSCULAR | Status: DC

## 2023-10-10 MED ORDER — THIAMINE MONONITRATE 100 MG PO TABS: 100.0000 mg | ORAL_TABLET | Freq: Every day | ORAL | Status: DC

## 2023-10-10 MED ORDER — PROCHLORPERAZINE EDISYLATE 10 MG/2ML IJ SOLN: 10.0000 mg | Freq: Four times a day (QID) | INTRAMUSCULAR | Status: DC | PRN

## 2023-10-10 MED ORDER — LACTATED RINGERS IV BOLUS (SEPSIS)
1000.0000 mL | Freq: Once | INTRAVENOUS | Status: AC
  Administered 2023-10-10: 1000 mL via INTRAVENOUS

## 2023-10-10 MED ORDER — NICOTINE 21 MG/24HR TD PT24: 21.0000 mg | MEDICATED_PATCH | Freq: Every day | TRANSDERMAL | Status: DC | PRN

## 2023-10-10 MED ORDER — POTASSIUM CHLORIDE 10 MEQ/100ML IV SOLN
10.0000 meq | INTRAVENOUS | Status: AC
  Administered 2023-10-10: 10 meq via INTRAVENOUS
  Filled 2023-10-10: qty 100

## 2023-10-10 MED ORDER — ADULT MULTIVITAMIN W/MINERALS CH
1.0000 | ORAL_TABLET | Freq: Every day | ORAL | Status: DC
  Administered 2023-10-10 – 2023-10-17 (×5): 1 via ORAL
  Filled 2023-10-10 (×7): qty 1

## 2023-10-10 MED ORDER — FENTANYL CITRATE PF 50 MCG/ML IJ SOSY
50.0000 ug | PREFILLED_SYRINGE | Freq: Once | INTRAMUSCULAR | Status: DC
  Filled 2023-10-10: qty 1

## 2023-10-10 MED ORDER — K PHOS MONO-SOD PHOS DI & MONO 155-852-130 MG PO TABS
500.0000 mg | ORAL_TABLET | Freq: Two times a day (BID) | ORAL | Status: DC
  Administered 2023-10-10: 500 mg via ORAL
  Filled 2023-10-10 (×2): qty 2

## 2023-10-10 MED ORDER — POTASSIUM CHLORIDE CRYS ER 20 MEQ PO TBCR
40.0000 meq | EXTENDED_RELEASE_TABLET | Freq: Once | ORAL | Status: DC
  Filled 2023-10-10: qty 2

## 2023-10-10 MED ORDER — SODIUM CHLORIDE 0.9 % IV SOLN
2.0000 g | Freq: Two times a day (BID) | INTRAVENOUS | Status: DC
  Administered 2023-10-10 – 2023-10-12 (×4): 2 g via INTRAVENOUS
  Filled 2023-10-10 (×4): qty 12.5

## 2023-10-10 MED ORDER — LACTATED RINGERS IV BOLUS (SEPSIS)
500.0000 mL | Freq: Once | INTRAVENOUS | Status: AC
  Administered 2023-10-10: 500 mL via INTRAVENOUS

## 2023-10-10 MED ORDER — FOLIC ACID 1 MG PO TABS
1.0000 mg | ORAL_TABLET | Freq: Every day | ORAL | Status: DC
  Administered 2023-10-10 – 2023-10-17 (×5): 1 mg via ORAL
  Filled 2023-10-10 (×7): qty 1

## 2023-10-10 MED ORDER — LORAZEPAM 1 MG PO TABS
0.0000 mg | ORAL_TABLET | Freq: Four times a day (QID) | ORAL | Status: AC
  Administered 2023-10-10: 1 mg via ORAL
  Administered 2023-10-11: 4 mg via ORAL
  Filled 2023-10-10: qty 1
  Filled 2023-10-10: qty 4

## 2023-10-10 MED ORDER — LACTATED RINGERS IV BOLUS (SEPSIS)
250.0000 mL | Freq: Once | INTRAVENOUS | Status: AC
  Administered 2023-10-10: 250 mL via INTRAVENOUS

## 2023-10-10 MED ORDER — MAGNESIUM SULFATE 2 GM/50ML IV SOLN
2.0000 g | Freq: Once | INTRAVENOUS | Status: AC
  Administered 2023-10-10: 2 g via INTRAVENOUS
  Filled 2023-10-10: qty 50

## 2023-10-10 NOTE — ED Notes (Signed)
 Pt still difficult to arouse, but was able to wake up with sternal rub. Provider aware
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
## 2023-10-10 NOTE — Sepsis Progress Note (Signed)
 Sepsis protocol monitored by eLink ?

## 2023-10-10 NOTE — ED Notes (Signed)
 MD notified of pt refusal of taking PO potassium, IV potassium has been started.

## 2023-10-10 NOTE — ED Triage Notes (Signed)
 Patient presented to ER with memory issues, overall weakness, stomach pain/flank pain. Patient states she was walking this morning and thought someone was trying to get her and did not want to go home. She came to ER and said that she just feels overall bad, temp 100.4, HR 126, BP 81/55.

## 2023-10-10 NOTE — ED Provider Notes (Signed)
 [place] EMERGENCY DEPARTMENT AT [facility] Provider Note   CSN: [id number] Arrival date & time: 10/10/23  0847     History  Chief complaint weakness  Whitney Murphy is a 53 y.o. female.  HPI   Patient has a history of hypertension hypertriglyceridemia asthma anxiety depression cervical cancer kidney stones acid reflux.  Patient states she has been having some issues for a month or 2 now with her memory.  Patient also has history of vertigo ongoing for years..  This has been evaluated by an neurologist in the past.  MRIs were recommended but patient was not able to afford that.  Patient feels like the vertigo symptoms have been increasing recently.  She gets episodes of feeling very lightheaded.  However in the last couple of days she also started experiencing pain in her abdomen from Neath her breasts down to her pelvis.  She denies.  She has had some vomiting.  No diarrhea no dysuria.  Patient denies any prior abdominal surgeries.  She does smoke cigarettes.  She does drink alcohol.  Patient states not daily but more than occasionally.  She denies any drug use Home Medications Prior to Admission medications   Medication Sig Start Date End Date Taking? Authorizing Provider  aspirin  325 MG tablet Take 325 mg by mouth 2 (two) times daily. 09/20/21   [provider]  atenolol  (TENORMIN ) 25 MG tablet Take 25 mg by mouth daily. 09/10/21   [provider]  atorvastatin  (LIPITOR) 20 MG tablet Take 1 tablet by mouth at bedtime. 09/18/20   [provider]  diclofenac Sodium (VOLTAREN) 1 % GEL Apply 2 g topically 3 (three) times a week.    [provider]  docusate sodium  (COLACE) 100 MG capsule Take 100 mg by mouth 2 (two) times daily. 09/20/21   [provider]  ibuprofen  (ADVIL ) 800 MG tablet Take 800 mg by mouth 3 (three) times daily.    [provider]  meclizine  (ANTIVERT ) 25 MG tablet Take 25 mg by mouth 2 (two) times daily.  07/19/21   [provider]  mirtazapine  (REMERON ) 15 MG tablet Take 15 mg by mouth at bedtime. 04/14/18   [provider]  NEURONTIN  300 MG capsule Take 300 mg by mouth 2 (two) times daily. 09/18/20   [provider]  omeprazole (PRILOSEC) 20 MG capsule Take 20 mg by mouth daily. 09/18/20   [provider]  ondansetron  (ZOFRAN ) 8 MG tablet Take 8 mg by mouth every 8 (eight) hours as needed for nausea/vomiting. 09/20/21   [provider]  oxyCODONE  (OXY IR/ROXICODONE ) 5 MG immediate release tablet Take 5 mg by mouth every 8 (eight) hours. 09/20/21   [provider]  oxyCODONE -acetaminophen  (PERCOCET/ROXICET) 5-325 MG tablet Take 1 tablet by mouth every 6 (six) hours as needed for severe pain. 12/31/22   Harris, Abigail, PA-C  SEROQUEL  50 MG tablet Take 50 mg by mouth at bedtime. 07/03/20   [provider]  sertraline  (ZOLOFT ) 50 MG tablet Take 50 mg by mouth daily.    [provider]  TURMERIC PO Take 1 tablet by mouth daily. 09/10/21   [provider]  pantoprazole  (PROTONIX ) 20 MG tablet Take 1 tablet (20 mg total) by mouth daily. Patient not taking: No sig reported 11/11/17 10/17/20  Caccavale, Sophia, PA-C      Allergies    Patient has no known allergies.    Review of Systems   Review of Systems  Physical Exam Updated Vital  Signs BP 105/70   Pulse 94   Temp (!) 100.4 F (38 C) (Oral)   Resp 16   LMP 03/22/2016   SpO2 94%  Physical Exam Vitals and nursing note reviewed.  Constitutional:      Appearance: She is well-developed. She is ill-appearing.  HENT:     Head: Normocephalic and atraumatic.     Right Ear: External ear normal.     Left Ear: External ear normal.  Eyes:     General: No scleral icterus.       Right eye: No discharge.        Left eye: No discharge.     Conjunctiva/sclera: Conjunctivae normal.  Neck:     Trachea: No tracheal deviation.  Cardiovascular:     Rate and Rhythm: Normal rate and  regular rhythm.  Pulmonary:     Effort: Pulmonary effort is normal. No respiratory distress.     Breath sounds: Normal breath sounds. No stridor. No wheezing or rales.  Abdominal:     General: Bowel sounds are normal. There is no distension.     Palpations: Abdomen is soft.     Tenderness: There is abdominal tenderness in the epigastric area. There is no guarding or rebound.  Musculoskeletal:        General: No tenderness or deformity.     Cervical back: Neck supple.  Skin:    General: Skin is warm and dry.     Findings: No rash.  Neurological:     General: No focal deficit present.     Mental Status: She is alert.     Cranial Nerves: No cranial nerve deficit, dysarthria or facial asymmetry.     Sensory: No sensory deficit.     Motor: No abnormal muscle tone or seizure activity.     Coordination: Coordination normal.  Psychiatric:        Mood and Affect: Mood normal.     ED Results / Procedures / Treatments   Labs (all labs ordered are listed, but only abnormal results are displayed) Labs Reviewed  CBC WITH DIFFERENTIAL/PLATELET - Abnormal; Notable for the following components:      Result Value   WBC 19.5 (*)    RBC 3.64 (*)    MCV 103.6 (*)    MCH 34.3 (*)    Platelets 146 (*)    Neutro Abs 16.4 (*)    Abs Immature Granulocytes 0.22 (*)    All other components within normal limits  COMPREHENSIVE METABOLIC PANEL WITH GFR - Abnormal; Notable for the following components:   Sodium 130 (*)    Potassium 2.7 (*)    Glucose, Bld 102 (*)    BUN 21 (*)    Creatinine, Ser 1.54 (*)    Calcium  7.5 (*)    Total Protein 6.0 (*)    Albumin 2.6 (*)    AST 280 (*)    ALT 99 (*)    Alkaline Phosphatase 162 (*)    Total Bilirubin 2.1 (*)    GFR, Estimated 40 (*)    All other components within normal limits  LIPASE, BLOOD - Abnormal; Notable for the following components:   Lipase 159 (*)    All other components within normal limits  I-STAT CG4 LACTIC ACID, ED - Abnormal;  Notable for the following components:   Lactic Acid, Venous 2.5 (*)    All other components within normal limits  RESP PANEL BY RT-PCR (RSV, FLU A&B, COVID)  RVPGX2  CULTURE, BLOOD (ROUTINE X 2)  CULTURE, BLOOD (ROUTINE X 2)  URINALYSIS, W/ REFLEX TO CULTURE (INFECTION SUSPECTED)  PROTIME-INR  ETHANOL  RAPID URINE DRUG SCREEN, HOSP PERFORMED  I-STAT CG4 LACTIC ACID, ED    EKG EKG Interpretation Date/Time:  Friday Oct 10 2023 09:04:05 EDT Ventricular Rate:  107 PR Interval:  118 QRS Duration:  84 QT Interval:  392 QTC Calculation: 523 R Axis:   63  Text Interpretation: Sinus tachycardia Nonspecific T abnrm, anterolateral leads Prolonged QT interval No significant change since last tracing New prolonged QT Confirmed by Zackowski, Scott [phone]) on 10/10/2023 9:34:10 AM  Radiology US  Abdomen Limited RUQ (LIVER/GB) Result Date: 10/10/2023 CLINICAL DATA:  Pancreatitis EXAM: ULTRASOUND ABDOMEN LIMITED RIGHT UPPER QUADRANT COMPARISON:  CT abdomen pelvis May 05, 2018 FINDINGS: Gallbladder: No gallstones or wall thickening visualized. No sonographic Murphy sign noted by sonographer. Common bile duct: Diameter: 5.9 mm Liver: Mildly coarsened echogenicity. No focal lesion. Central portal vein is patent with normal direction of flow. Within the peripheral aspect of the liver there is a linear echogenic structure which is nonspecific. Possibility of peripheral portal vein with small amount of internal thrombus not excluded. Other: None. IMPRESSION: 1. No cholelithiasis or sonographic evidence for acute cholecystitis. 2. Central portal vein is patent with normal direction of flow. Within the peripheral aspect of the liver there is a linear echogenic structure which is nonspecific. Possibility of peripheral portal vein with small amount of internal thrombus not excluded. Consider further evaluation with CT abdomen pelvis with contrast. 3. Hepatic steatosis. Electronically Signed   By: Jone Neither M.D.    On: 10/10/2023 13:10   CT Head Wo Contrast Result Date: 10/10/2023 CLINICAL DATA:  Mental status change, weakness. EXAM: CT HEAD WITHOUT CONTRAST TECHNIQUE: Contiguous axial images were obtained from the base of the skull through the vertex without intravenous contrast. RADIATION DOSE REDUCTION: This exam was performed according to the departmental dose-optimization program which includes automated exposure control, adjustment of the mA and/or kV according to patient size and/or use of iterative reconstruction technique. COMPARISON:  CT head 05/19/2021. FINDINGS: Brain: No acute intracranial hemorrhage. No CT evidence of acute infarct. No edema, mass effect, or midline shift. The basilar cisterns are patent. Ventricles: The ventricles are normal. Vascular: No hyperdense vessel or unexpected calcification. Skull: No acute or aggressive finding. Orbits: Orbits are symmetric. Sinuses: The visualized paranasal sinuses are clear. Other: Mastoid air cells are clear. IMPRESSION: No CT evidence of acute intracranial abnormality. Electronically Signed   By: Denny Flack M.D.   On: 10/10/2023 10:34   DG Chest Port 1 View Result Date: 10/10/2023 CLINICAL DATA:  Questionable sepsis - evaluate for abnormality. EXAM: PORTABLE CHEST 1 VIEW COMPARISON:  12/31/2022. FINDINGS: Bilateral lung fields are clear. Bilateral costophrenic angles are clear. Normal cardio-mediastinal silhouette. No acute osseous abnormalities. Subacute/healing posterior right eighth and ninth rib fractures noted. The soft tissues are within normal limits. IMPRESSION: No active disease. Electronically Signed   By: Beula Brunswick M.D.   On: 10/10/2023 09:50    Procedures .Critical Care  Performed by: Trish Furl, MD Authorized by: Trish Furl, MD   Critical care provider statement:    Critical care time (minutes):  30   Critical care was time spent personally by me on the following activities:  Development of treatment plan with patient or  surrogate, discussions with consultants, evaluation of patient's response to treatment, examination of patient, ordering and review of laboratory studies, ordering and review of radiographic studies, ordering and performing treatments and interventions, pulse oximetry,  re-evaluation of patient's condition and review of old charts     Medications Ordered in ED Medications  lactated ringers  infusion ( Intravenous New Bag/Given 10/10/23 1019)  potassium chloride  SA (KLOR-CON  M) CR tablet 40 mEq (has no administration in time range)  potassium chloride  10 mEq in 100 mL IVPB (has no administration in time range)  lactated ringers  bolus 1,000 mL (0 mLs Intravenous Stopped 10/10/23 1127)    And  lactated ringers  bolus 500 mL (0 mLs Intravenous Stopped 10/10/23 1127)    And  lactated ringers  bolus 250 mL (0 mLs Intravenous Stopped 10/10/23 1127)  ceFEPIme  (MAXIPIME ) 2 g in sodium chloride  0.9 % 100 mL IVPB (0 g Intravenous Stopped 10/10/23 1127)  metroNIDAZOLE  (FLAGYL ) IVPB 500 mg (0 mg Intravenous Stopped 10/10/23 1237)    ED Course/ Medical Decision Making/ A&P Clinical Course as of 10/10/23 1325  Fri Oct 10, 2023  1025 CBC with Differential(!) Increased WBC [JK]  1029 I-Stat Lactic Acid, ED(!!) Lactic acid level elevated [JK]  1029 Blood pressure improving with IV fluid hydration [JK]  1034 X-ray without acute findings. [JK]  1047 BP improved  [JK]  1110 Head CT without acute abnormalities. [JK]  1110 Chest x-ray with acute abnormalities. [JK]  1204 Patient is somnolent at bedside although she will wake up and answer my questions after I shake her lightly [JK]  1213 Labs reviewed from patient's office visit yesterday at [facility].  She had a lipase of 777 [JK]  1214 Patient's creatinine was increased at 1.65 [JK]  1324 Case discussed with Dr. Bonita Bussing regarding admission. [JK]  1325 Ultrasound shows no cholelithiasis or acute cholecystitis [JK]    Clinical Course User Index [JK] Trish Furl, MD                                  Medical Decision Making Problems Addressed: Alcohol-induced acute pancreatitis, unspecified complication status: acute illness or injury that poses a threat to life or bodily functions Hypokalemia: acute illness or injury that poses a threat to life or bodily functions  Amount and/or Complexity of Data Reviewed Labs: ordered. Decision-making details documented in ED Course. Radiology: ordered and independent interpretation performed.  Risk Prescription drug management. Drug therapy requiring intensive monitoring for toxicity. Decision regarding hospitalization.   Patient presented to the ED with complaints of abdominal pain associated with hypotension and weakness.  Patient noted to have a fever up to 100.4.  She was hypotensive.  Patient started on sepsis protocol.  Blood pressure has improved and she has remained normotensive in the ED.  Patient's laboratory test were notable for elevated BUN and creatinine.  She also has elevated liver enzymes hypokalemia and hyponatremia.  Patient also has significant leukocytosis and an elevated lipase.  Presentation is consistent with pancreatitis.  I suspect alcohol related based on her history.  Ultrasound was performed to rule out any signs of cholecystitis or choledocholithiasis.  Potassium replacement ordered.  Will consult the medical service for admission further treatment.        Final Clinical Impression(s) / ED Diagnoses Final diagnoses:  Alcohol-induced acute pancreatitis, unspecified complication status  Hypokalemia  AKI (acute kidney injury) [facility])    Rx / DC Orders ED Discharge Orders     None         Trish Furl, MD 10/10/23 1325

## 2023-10-10 NOTE — ED Notes (Signed)
 Went into room to hang flagyl  and complete other fluid bags. Pt was extremely difficult to arouse and only responded to shaking awake. VSS provider aware

## 2023-10-10 NOTE — ED Notes (Signed)
 Patient is resting comfortably.

## 2023-10-11 DIAGNOSIS — A419 Sepsis, unspecified organism: Secondary | ICD-10-CM | POA: Diagnosis not present

## 2023-10-11 LAB — BASIC METABOLIC PANEL WITH GFR
Anion gap: 11 (ref 5–15)
Anion gap: 12 (ref 5–15)
BUN: 10 mg/dL (ref 6–20)
BUN: 7 mg/dL (ref 6–20)
CO2: 22 mmol/L (ref 22–32)
CO2: 21 mmol/L — ABNORMAL LOW (ref 22–32)
Calcium: 7.6 mg/dL — ABNORMAL LOW (ref 8.9–10.3)
Calcium: 7.4 mg/dL — ABNORMAL LOW (ref 8.9–10.3)
Chloride: 101 mmol/L (ref 98–111)
Chloride: 100 mmol/L (ref 98–111)
Creatinine, Ser: 0.61 mg/dL (ref 0.44–1.00)
Creatinine, Ser: 0.55 mg/dL (ref 0.44–1.00)
GFR, Estimated: >60 mL/min (ref >60)
GFR, Estimated: >60 mL/min (ref >60)
Glucose, Bld: 80 mg/dL (ref 70–99)
Glucose, Bld: 75 mg/dL (ref 70–99)
Potassium: 3 mmol/L — ABNORMAL LOW (ref 3.5–5.1)
Potassium: 2.8 mmol/L — ABNORMAL LOW (ref 3.5–5.1)
Sodium: 134 mmol/L — ABNORMAL LOW (ref 135–145)
Sodium: 133 mmol/L — ABNORMAL LOW (ref 135–145)

## 2023-10-11 LAB — COMPREHENSIVE METABOLIC PANEL WITH GFR
ALT: 83 U/L — ABNORMAL HIGH (ref 0–44)
AST: 250 U/L — ABNORMAL HIGH (ref 15–41)
Albumin: 2.4 g/dL — ABNORMAL LOW (ref 3.5–5.0)
Alkaline Phosphatase: 209 U/L — ABNORMAL HIGH (ref 38–126)
Anion gap: 10 (ref 5–15)
BUN: 10 mg/dL (ref 6–20)
CO2: 23 mmol/L (ref 22–32)
Calcium: 7.4 mg/dL — ABNORMAL LOW (ref 8.9–10.3)
Chloride: 101 mmol/L (ref 98–111)
Creatinine, Ser: 0.66 mg/dL (ref 0.44–1.00)
GFR, Estimated: >60 mL/min (ref >60)
Glucose, Bld: 80 mg/dL (ref 70–99)
Potassium: 2.3 mmol/L — CL (ref 3.5–5.1)
Sodium: 134 mmol/L — ABNORMAL LOW (ref 135–145)
Total Bilirubin: 1.9 mg/dL — ABNORMAL HIGH (ref 0.0–1.2)
Total Protein: 5.7 g/dL — ABNORMAL LOW (ref 6.5–8.1)

## 2023-10-11 LAB — CBC WITH DIFFERENTIAL/PLATELET
Abs Immature Granulocytes: 0.06 10*3/uL (ref 0.00–0.07)
Basophils Absolute: 0 10*3/uL (ref 0.0–0.1)
Basophils Relative: 0 %
Eosinophils Absolute: 0.1 10*3/uL (ref 0.0–0.5)
Eosinophils Relative: 0 %
HCT: 28.2 % — ABNORMAL LOW (ref 36.0–46.0)
Hemoglobin: 9.5 g/dL — ABNORMAL LOW (ref 12.0–15.0)
Immature Granulocytes: 1 %
Lymphocytes Relative: 13 %
Lymphs Abs: 1.6 10*3/uL (ref 0.7–4.0)
MCH: 34.8 pg — ABNORMAL HIGH (ref 26.0–34.0)
MCHC: 33.7 g/dL (ref 30.0–36.0)
MCV: 103.3 fL — ABNORMAL HIGH (ref 80.0–100.0)
Monocytes Absolute: 0.9 10*3/uL (ref 0.1–1.0)
Monocytes Relative: 7 %
Neutro Abs: 10.4 10*3/uL — ABNORMAL HIGH (ref 1.7–7.7)
Neutrophils Relative %: 79 %
Platelets: 134 10*3/uL — ABNORMAL LOW (ref 150–400)
RBC: 2.73 MIL/uL — ABNORMAL LOW (ref 3.87–5.11)
RDW: 13.4 % (ref 11.5–15.5)
WBC: 13 10*3/uL — ABNORMAL HIGH (ref 4.0–10.5)
nRBC: 0 % (ref 0.0–0.2)

## 2023-10-11 LAB — HEPATITIS PANEL, ACUTE
HCV Ab: NONREACTIVE
Hep A IgM: NONREACTIVE
Hep B C IgM: NONREACTIVE
Hepatitis B Surface Ag: NONREACTIVE

## 2023-10-11 LAB — AMMONIA: Ammonia: 33 umol/L (ref 9–35)

## 2023-10-11 LAB — URINALYSIS, W/ REFLEX TO CULTURE (INFECTION SUSPECTED)
Bacteria, UA: NONE SEEN
Bilirubin Urine: NEGATIVE
Glucose, UA: NEGATIVE mg/dL
Hgb urine dipstick: NEGATIVE
Ketones, ur: 5 mg/dL — AB
Leukocytes,Ua: NEGATIVE
Nitrite: NEGATIVE
Protein, ur: NEGATIVE mg/dL
Specific Gravity, Urine: 1.025 (ref 1.005–1.030)
pH: 6 (ref 5.0–8.0)

## 2023-10-11 LAB — HIV ANTIBODY (ROUTINE TESTING W REFLEX): HIV Screen 4th Generation wRfx: NONREACTIVE

## 2023-10-11 LAB — MAGNESIUM: Magnesium: 2.3 mg/dL (ref 1.7–2.4)

## 2023-10-11 LAB — PHOSPHORUS: Phosphorus: 2 mg/dL — ABNORMAL LOW (ref 2.5–4.6)

## 2023-10-11 LAB — LIPASE, BLOOD: Lipase: 76 U/L — ABNORMAL HIGH (ref 11–51)

## 2023-10-11 LAB — TRIGLYCERIDES: Triglycerides: 91 mg/dL (ref <150)

## 2023-10-11 MED ORDER — LACTATED RINGERS IV SOLN: INTRAVENOUS | Status: DC

## 2023-10-11 MED ORDER — POTASSIUM CHLORIDE 10 MEQ/100ML IV SOLN
10.0000 meq | INTRAVENOUS | Status: AC
  Administered 2023-10-11 – 2023-10-12 (×4): 10 meq via INTRAVENOUS
  Filled 2023-10-11 (×4): qty 100

## 2023-10-11 MED ORDER — THIAMINE HCL 100 MG/ML IJ SOLN
250.0000 mg | Freq: Every day | INTRAVENOUS | Status: DC
  Administered 2023-10-14 – 2023-10-17 (×4): 250 mg via INTRAVENOUS
  Filled 2023-10-11 (×4): qty 2.5

## 2023-10-11 MED ORDER — POTASSIUM CHLORIDE 2 MEQ/ML IV SOLN
INTRAVENOUS | Status: AC
  Filled 2023-10-11 (×5): qty 1000

## 2023-10-11 MED ORDER — POLYETHYLENE GLYCOL 3350 17 G PO PACK: 17.0000 g | PACK | Freq: Every day | ORAL | Status: DC | PRN

## 2023-10-11 MED ORDER — ENOXAPARIN SODIUM 40 MG/0.4ML IJ SOSY
40.0000 mg | PREFILLED_SYRINGE | Freq: Every day | INTRAMUSCULAR | Status: DC
  Administered 2023-10-11 – 2023-10-17 (×7): 40 mg via SUBCUTANEOUS
  Filled 2023-10-11 (×7): qty 0.4

## 2023-10-11 MED ORDER — THIAMINE HCL 100 MG/ML IJ SOLN: 100.0000 mg | Freq: Every day | INTRAMUSCULAR | Status: DC

## 2023-10-11 MED ORDER — THIAMINE HCL 100 MG/ML IJ SOLN
500.0000 mg | Freq: Three times a day (TID) | INTRAVENOUS | Status: AC
  Administered 2023-10-11 – 2023-10-13 (×8): 500 mg via INTRAVENOUS
  Filled 2023-10-11 (×8): qty 5

## 2023-10-11 MED ORDER — POTASSIUM CHLORIDE 10 MEQ/100ML IV SOLN
10.0000 meq | INTRAVENOUS | Status: AC
  Administered 2023-10-11 (×6): 10 meq via INTRAVENOUS
  Filled 2023-10-11 (×3): qty 100

## 2023-10-11 NOTE — Progress Notes (Signed)
 PROGRESS NOTE    Whitney Murphy  ZHY:[id number] DOB: [birth year] DOA: 10/10/2023 PCP: Pcp, No  Chief Complaint  Patient presents with   Fatigue   Weakness    Brief Narrative:   53 yo with hx etoh abuse (chart history cirrhosis), HTN, depression, anxiety and multiple other medical issues here with abdominal pain, found to have pancreatitis.   Assessment & Plan:   Principal Problem:   Sepsis due to undetermined organism [facility]) Active Problems:   Macrocytosis   Thrombocytopenia (HCC)   Moderate protein malnutrition (HCC)   Abnormal LFTs   AKI (acute kidney injury) (HCC)   Tobacco abuse   Hypokalemia   Prolonged QT interval   Alcohol abuse   Alcohol induced acute pancreatitis   Hyponatremia   Abdominal pain   Nausea and vomiting   Leukocytosis   Lactic acidosis   Hypophosphatemia  Alcoholic Pancreatitis RUQ US  without cholelithiasis.  Hx supports alcoholic pancreatitis.   CT findings c/w pancreatitis.  Possible developing necrosis on imaging.  Not significantly TTP today, but she's significantly altered from mental status standpoint Continue aggressive IVF NPO for now Strict I/O, daily weights Low threshold for repeat imaging  Pain management, bowel regimen   Systemic Inflammatory Response Syndrome Fever, leukocytosis, tachycardia - no clear infectious source, this could be inflammatory related to pancreatitis above CT abd with pancreatitis, possible necrosis (no fluid collections) For now on broad abx UA bland Blood cultures pending Consider additional imaging  Acute Metabolic Encephalopathy Complicated Alcohol Withdrawal   Delirium T[place] Head CT without acute abnormality High dose thiamine  B12, folate, TSH, ammonia, RPR Tox with THC Continue ciwa with prn ativan  Delirium precautions  Cognitive Deficit Possibly related to chronic etoh use Plan for outpatient MRI at some point?  For now, delirium precautions and will manage acute issues.  Severe  Hypokalemia Replace and follow   AKI Resolved  Hypocalcemia Corrects with albumin   Hypophosphatemia Follow and replace  Alcoholic Hepatitis Hepatitis Steatosis  Low maddrey's discriminant score Supportive care Acute hepatitis panel pending Chart history of cirrhosis, this isn't immediately clear to me - CT with steatosis, does have hypoalbuminemia, elevated bili, mild thrombocytopenia.  INR is wnl.  Will need outpatient follow up.  Prolonged Qtc Repeat EKG Caution with qt prolonging meds      DVT prophylaxis: lovenox  Code Status: full Family Communication: none Disposition:   Status is: Inpatient Remains inpatient appropriate because: need for ongoing care   Consultants:  none  Procedures:  none  Antimicrobials:  Anti-infectives (From admission, onward)    Start     Dose/Rate Route Frequency Ordered Stop   10/11/23 0000  metroNIDAZOLE  (FLAGYL ) IVPB 500 mg        500 mg 100 mL/hr over 60 Minutes Intravenous Every 12 hours 10/10/23 1335 10/17/23 2359   10/10/23 2200  ceFEPIme  (MAXIPIME ) 2 g in sodium chloride  0.9 % 100 mL IVPB        2 g 200 mL/hr over 30 Minutes Intravenous Every 12 hours 10/10/23 1335     10/10/23 0930  ceFEPIme  (MAXIPIME ) 2 g in sodium chloride  0.9 % 100 mL IVPB        2 g 200 mL/hr over 30 Minutes Intravenous  Once 10/10/23 0916 10/10/23 1127   10/10/23 0930  metroNIDAZOLE  (FLAGYL ) IVPB 500 mg        500 mg 100 mL/hr over 60 Minutes Intravenous  Once 10/10/23 0916 10/10/23 1237       Subjective: Incomprehensible speech   Objective: Vitals:  10/11/23 0254 10/11/23 0529 10/11/23 0800 10/11/23 0922  BP: 108/64 121/77 (!) 119/95 (!) 119/95  Pulse: (!) 111 (!) 109 (!) 105 (!) 105  Resp: 20 (!) 24  16  Temp: (!) 100.7 F (38.2 C) 98.4 F (36.9 C)  99.8 F (37.7 C)  TempSrc: Oral Oral  Axillary  SpO2: 93%     Weight:      Height:        Intake/Output Summary (Last 24 hours) at 10/11/2023 7829 Last data filed at 10/11/2023  0900 Gross per 24 hour  Intake 4481.33 ml  Output 600 ml  Net 3881.33 ml   Filed Weights   10/10/23 1609  Weight: 51.6 kg    Examination:  General exam: lethargic, confused appearing Respiratory system: unlabored Cardiovascular system: RRR Gastrointestinal system: s/nt/nd - not particularly TTP on exam Central nervous system: delirious, nonsensical speech  Extremities: no LEE  Data Reviewed: I have personally reviewed following labs and imaging studies  CBC: Recent Labs  Lab 10/10/23 0914 10/11/23 0714  WBC 19.5* 13.0*  NEUTROABS 16.4* 10.4*  HGB 12.5 9.5*  HCT 37.7 28.2*  MCV 103.6* 103.3*  PLT 146* 134*    Basic Metabolic Panel: Recent Labs  Lab 10/10/23 1141 10/10/23 1650 10/11/23 0714  NA 130*  --  134*  K 2.7*  --  2.3*  CL 99  --  101  CO2 22  --  23  GLUCOSE 102*  --  80  BUN 21*  --  10  CREATININE 1.54*  --  0.66  CALCIUM  7.5*  --  7.4*  MG  --  1.8  --   PHOS  --  2.1*  --     GFR: Estimated Creatinine Clearance: 66.2 mL/min (by C-G formula based on SCr of 0.66 mg/dL).  Liver Function Tests: Recent Labs  Lab 10/10/23 1141 10/11/23 0714  AST 280* 250*  ALT 99* 83*  ALKPHOS 162* 209*  BILITOT 2.1* 1.9*  PROT 6.0* 5.7*  ALBUMIN 2.6* 2.4*    CBG: No results for input(s): "GLUCAP" in the last 168 hours.   Recent Results (from the past 240 hours)  Resp panel by RT-PCR (RSV, Flu Whitney Murphy&B, Covid) Anterior Nasal Swab     Status: None   Collection Time: 10/10/23  9:14 AM   Specimen: Anterior Nasal Swab  Result Value Ref Range Status   SARS Coronavirus 2 by RT PCR NEGATIVE NEGATIVE Final    Comment: (NOTE) SARS-CoV-2 target nucleic acids are NOT DETECTED.  The SARS-CoV-2 RNA is generally detectable in upper respiratory specimens during the acute phase of infection. The lowest concentration of SARS-CoV-2 viral copies this assay can detect is 138 copies/mL. Whitney Murphy negative result does not preclude SARS-Cov-2 infection and should not be used as  the sole basis for treatment or other patient management decisions. Whitney Murphy negative result may occur with  improper specimen collection/handling, submission of specimen other than nasopharyngeal swab, presence of viral mutation(s) within the areas targeted by this assay, and inadequate number of viral copies(<138 copies/mL). Whitney Murphy negative result must be combined with clinical observations, patient history, and epidemiological information. The expected result is Negative.  Fact Sheet for Patients:  BloggerCourse.com  Fact Sheet for Healthcare Providers:  SeriousBroker.it  This test is no t yet approved or cleared by the [place]  FDA and  has been authorized for detection and/or diagnosis of SARS-CoV-2 by FDA under an Emergency Use Authorization (EUA). This EUA will remain  in effect (meaning this test can be used)  for the duration of the COVID-19 declaration under Section 564(b)(1) of the Act, 21 U.S.C.section 360bbb-3(b)(1), unless the authorization is terminated  or revoked sooner.       Influenza Amybeth Sieg by PCR NEGATIVE NEGATIVE Final   Influenza B by PCR NEGATIVE NEGATIVE Final    Comment: (NOTE) The Xpert Xpress SARS-CoV-2/FLU/RSV plus assay is intended as an aid in the diagnosis of influenza from Nasopharyngeal swab specimens and should not be used as Whitney Murphy sole basis for treatment. Nasal washings and aspirates are unacceptable for Xpert Xpress SARS-CoV-2/FLU/RSV testing.  Fact Sheet for Patients: BloggerCourse.com  Fact Sheet for Healthcare Providers: SeriousBroker.it  This test is not yet approved or cleared by the [place]  FDA and has been authorized for detection and/or diagnosis of SARS-CoV-2 by FDA under an Emergency Use Authorization (EUA). This EUA will remain in effect (meaning this test can be used) for the duration of the COVID-19 declaration under Section 564(b)(1) of  the Act, 21 U.S.C. section 360bbb-3(b)(1), unless the authorization is terminated or revoked.     Resp Syncytial Virus by PCR NEGATIVE NEGATIVE Final    Comment: (NOTE) Fact Sheet for Patients: BloggerCourse.com  Fact Sheet for Healthcare Providers: SeriousBroker.it  This test is not yet approved or cleared by the [place]  FDA and has been authorized for detection and/or diagnosis of SARS-CoV-2 by FDA under an Emergency Use Authorization (EUA). This EUA will remain in effect (meaning this test can be used) for the duration of the COVID-19 declaration under Section 564(b)(1) of the Act, 21 U.S.C. section 360bbb-3(b)(1), unless the authorization is terminated or revoked.  Performed at [facility], [address] [place], [place] [address]   Blood Culture (routine x 2)     Status: None (Preliminary result)   Collection Time: 10/10/23  9:32 AM   Specimen: BLOOD  Result Value Ref Range Status   Specimen Description   Final    BLOOD LEFT ANTECUBITAL Performed at [facility], [address] [place], [place] [address]    Special Requests   Final    BOTTLES DRAWN AEROBIC AND ANAEROBIC Blood Culture results may not be optimal due to an inadequate volume of blood received in culture bottles Performed at [facility], [address], [place], [place] [address]    Culture   Final    NO GROWTH < 24 HOURS Performed at [facility], [address] [place], [place] [address]    Report Status PENDING  Incomplete  Blood Culture (routine x 2)     Status: None (Preliminary result)   Collection Time: 10/10/23  9:35 AM   Specimen: BLOOD  Result Value Ref Range Status   Specimen Description   Final    BLOOD RIGHT ANTECUBITAL Performed at [facility], [address], [place], [place] [address]    Special Requests   Final    BOTTLES DRAWN AEROBIC AND  ANAEROBIC Blood Culture adequate volume Performed at [facility], [address] [place], [place] [address]    Culture   Final    NO GROWTH < 24 HOURS Performed at [facility], [address] [place], [place] [address]    Report Status PENDING  Incomplete         Radiology Studies: CT ABDOMEN PELVIS W CONTRAST Result Date: 10/10/2023 CLINICAL DATA:  Acute abdominal pain EXAM: CT ABDOMEN AND PELVIS WITH CONTRAST TECHNIQUE: Multidetector CT imaging of the abdomen and pelvis was performed using  the standard protocol following bolus administration of intravenous contrast. RADIATION DOSE REDUCTION: This exam was performed according to the departmental dose-optimization program which includes automated exposure control, adjustment of the mA and/or kV according to patient size and/or use of iterative reconstruction technique. CONTRAST:  OMNIPAQUE  IOHEXOL  300 MG/ML  SOLN COMPARISON:  CT abdomen and pelvis 05/05/2018. FINDINGS: Lower chest: There is minimal patchy atelectasis in the lung bases. There is Whitney Murphy trace left pleural effusion. Hepatobiliary: There is diffuse fatty infiltration of the liver. No focal liver lesions are seen. Gallbladder and bile ducts are within normal limits. Pancreas: There is Whitney Murphy moderate amount of fluid surrounding the body and tail the pancreas. There is Whitney Murphy geographic area of decreased pancreatic attenuation in the tail without discrete margins. No enhancing fluid collections are seen. There is no ductal dilatation. Spleen: Normal in size without focal abnormality. Adrenals/Urinary Tract: Adrenal glands are unremarkable. Kidneys are normal, without renal calculi, focal lesion, or hydronephrosis. Bladder is unremarkable. Stomach/Bowel: Stomach is within normal limits. No evidence of bowel wall thickening, distention, or inflammatory changes. The appendix is not seen. Vascular/Lymphatic: Aortic atherosclerosis. No enlarged abdominal or pelvic lymph  nodes. Reproductive: Uterus and bilateral adnexa are unremarkable. Other: There is Whitney Murphy small amount of free fluid in the pelvis and left upper quadrant. There is no free air or focal abdominal wall hernia. Musculoskeletal: No acute or significant osseous findings. IMPRESSION: 1. Findings compatible with acute pancreatitis. There is Whitney Murphy geographic area of decreased pancreatic attenuation in the tail without discrete margins. This may represent developing necrosis. No enhancing fluid collections are seen. 2. Small amount of free fluid in the pelvis and left upper quadrant. 3. Trace left pleural effusion. 4. Fatty infiltration of the liver. 5. Aortic atherosclerosis. Aortic Atherosclerosis (ICD10-I70.0). Electronically Signed   By: Whitney Murphy M.D.   On: 10/10/2023 21:22   US  Abdomen Limited RUQ (LIVER/GB) Result Date: 10/10/2023 CLINICAL DATA:  Pancreatitis EXAM: ULTRASOUND ABDOMEN LIMITED RIGHT UPPER QUADRANT COMPARISON:  CT abdomen pelvis May 05, 2018 FINDINGS: Gallbladder: No gallstones or wall thickening visualized. No sonographic Murphy sign noted by sonographer. Common bile duct: Diameter: 5.9 mm Liver: Mildly coarsened echogenicity. No focal lesion. Central portal vein is patent with normal direction of flow. Within the peripheral aspect of the liver there is Whitney Murphy linear echogenic structure which is nonspecific. Possibility of peripheral portal vein with small amount of internal thrombus not excluded. Other: None. IMPRESSION: 1. No cholelithiasis or sonographic evidence for acute cholecystitis. 2. Central portal vein is patent with normal direction of flow. Within the peripheral aspect of the liver there is Whitney Murphy linear echogenic structure which is nonspecific. Possibility of peripheral portal vein with small amount of internal thrombus not excluded. Consider further evaluation with CT abdomen pelvis with contrast. 3. Hepatic steatosis. Electronically Signed   By: Whitney Murphy M.D.   On: 10/10/2023 13:10   CT  Head Wo Contrast Result Date: 10/10/2023 CLINICAL DATA:  Mental status change, weakness. EXAM: CT HEAD WITHOUT CONTRAST TECHNIQUE: Contiguous axial images were obtained from the base of the skull through the vertex without intravenous contrast. RADIATION DOSE REDUCTION: This exam was performed according to the departmental dose-optimization program which includes automated exposure control, adjustment of the mA and/or kV according to patient size and/or use of iterative reconstruction technique. COMPARISON:  CT head 05/19/2021. FINDINGS: Brain: No acute intracranial hemorrhage. No CT evidence of acute infarct. No edema, mass effect, or midline shift. The basilar cisterns are patent. Ventricles: The ventricles are normal. Vascular:  No hyperdense vessel or unexpected calcification. Skull: No acute or aggressive finding. Orbits: Orbits are symmetric. Sinuses: The visualized paranasal sinuses are clear. Other: Mastoid air cells are clear. IMPRESSION: No CT evidence of acute intracranial abnormality. Electronically Signed   By: Whitney Murphy M.D.   On: 10/10/2023 10:34   DG Chest Port 1 View Result Date: 10/10/2023 CLINICAL DATA:  Questionable sepsis - evaluate for abnormality. EXAM: PORTABLE CHEST 1 VIEW COMPARISON:  12/31/2022. FINDINGS: Bilateral lung fields are clear. Bilateral costophrenic angles are clear. Normal cardio-mediastinal silhouette. No acute osseous abnormalities. Subacute/healing posterior right eighth and ninth rib fractures noted. The soft tissues are within normal limits. IMPRESSION: No active disease. Electronically Signed   By: Whitney Murphy M.D.   On: 10/10/2023 09:50        Scheduled Meds:  folic acid   1 mg Oral Daily   LORazepam   0-4 mg Oral Q6H   Followed by   Whitney Murphy ON 10/12/2023] LORazepam   0-4 mg Oral Q12H   multivitamin with minerals  1 tablet Oral Daily   phosphorus  500 mg Oral BID   potassium chloride   40 mEq Oral Once   [START ON 10/19/2023] thiamine  (VITAMIN B1)  injection  100 mg Intravenous Daily   thiamine   500 mg Intravenous Daily   Continuous Infusions:  ceFEPime  (MAXIPIME ) IV 2 g (10/11/23 0807)   lactated ringers  1,000 mL with potassium chloride  20 mEq infusion     metronidazole  500 mg (10/11/23 0013)   potassium chloride  10 mEq (10/11/23 0900)   thiamine  (VITAMIN B1) injection     Followed by   Whitney Murphy ON 10/14/2023] thiamine  (VITAMIN B1) injection       LOS: 1 day    Time spent: over 30 min    Donnetta Gains, MD [facility]   To contact the attending provider between 7A-7P or the covering provider during after hours 7P-7A, please log into the web site [email or web address] and access using universal [place] password for that web site. If you do not have the password, please call the hospital operator.  10/11/2023, 9:33 AM

## 2023-10-11 NOTE — Progress Notes (Signed)
 Date and time results received: 10/11/23 0849 (use smartphrase ".now" to insert current time)  Test: Potassium Critical Value: 2.3  Name of Provider Notified: Donnetta Gains MD  Orders Received? Or Actions Taken?: See ordersOrders Received - See Orders for details

## 2023-10-12 ENCOUNTER — Other Ambulatory Visit: Payer: Self-pay

## 2023-10-12 DIAGNOSIS — A419 Sepsis, unspecified organism: Secondary | ICD-10-CM | POA: Diagnosis not present

## 2023-10-12 LAB — CBC WITH DIFFERENTIAL/PLATELET
Abs Immature Granulocytes: 0.26 10*3/uL — ABNORMAL HIGH (ref 0.00–0.07)
Basophils Absolute: 0.1 10*3/uL (ref 0.0–0.1)
Basophils Relative: 0 %
Eosinophils Absolute: 0.1 10*3/uL (ref 0.0–0.5)
Eosinophils Relative: 0 %
HCT: 29.5 % — ABNORMAL LOW (ref 36.0–46.0)
Hemoglobin: 10.2 g/dL — ABNORMAL LOW (ref 12.0–15.0)
Immature Granulocytes: 1 %
Lymphocytes Relative: 9 %
Lymphs Abs: 1.9 10*3/uL (ref 0.7–4.0)
MCH: 34.8 pg — ABNORMAL HIGH (ref 26.0–34.0)
MCHC: 34.6 g/dL (ref 30.0–36.0)
MCV: 100.7 fL — ABNORMAL HIGH (ref 80.0–100.0)
Monocytes Absolute: 1.9 10*3/uL — ABNORMAL HIGH (ref 0.1–1.0)
Monocytes Relative: 9 %
Neutro Abs: 15.9 10*3/uL — ABNORMAL HIGH (ref 1.7–7.7)
Neutrophils Relative %: 81 %
Platelets: 164 10*3/uL (ref 150–400)
RBC: 2.93 MIL/uL — ABNORMAL LOW (ref 3.87–5.11)
RDW: 13.9 % (ref 11.5–15.5)
WBC: 20 10*3/uL — ABNORMAL HIGH (ref 4.0–10.5)
nRBC: 0.1 % (ref 0.0–0.2)

## 2023-10-12 LAB — COMPREHENSIVE METABOLIC PANEL WITH GFR
ALT: 77 U/L — ABNORMAL HIGH (ref 0–44)
AST: 203 U/L — ABNORMAL HIGH (ref 15–41)
Albumin: 2.4 g/dL — ABNORMAL LOW (ref 3.5–5.0)
Alkaline Phosphatase: 241 U/L — ABNORMAL HIGH (ref 38–126)
Anion gap: 10 (ref 5–15)
BUN: <5 mg/dL — ABNORMAL LOW (ref 6–20)
CO2: 23 mmol/L (ref 22–32)
Calcium: 7.8 mg/dL — ABNORMAL LOW (ref 8.9–10.3)
Chloride: 99 mmol/L (ref 98–111)
Creatinine, Ser: 0.47 mg/dL (ref 0.44–1.00)
GFR, Estimated: >60 mL/min (ref >60)
Glucose, Bld: 75 mg/dL (ref 70–99)
Potassium: 3.5 mmol/L (ref 3.5–5.1)
Sodium: 132 mmol/L — ABNORMAL LOW (ref 135–145)
Total Bilirubin: 2 mg/dL — ABNORMAL HIGH (ref 0.0–1.2)
Total Protein: 6.3 g/dL — ABNORMAL LOW (ref 6.5–8.1)

## 2023-10-12 LAB — TSH: TSH: 3.238 u[IU]/mL (ref 0.350–4.500)

## 2023-10-12 LAB — VITAMIN B12: Vitamin B-12: 1140 pg/mL — ABNORMAL HIGH (ref 180–914)

## 2023-10-12 LAB — MAGNESIUM: Magnesium: 1.9 mg/dL (ref 1.7–2.4)

## 2023-10-12 LAB — PHOSPHORUS: Phosphorus: 1.7 mg/dL — ABNORMAL LOW (ref 2.5–4.6)

## 2023-10-12 LAB — LIPASE, BLOOD: Lipase: 39 U/L (ref 11–51)

## 2023-10-12 LAB — FOLATE: Folate: 18.7 ng/mL (ref >5.9)

## 2023-10-12 MED ORDER — POTASSIUM CHLORIDE 2 MEQ/ML IV SOLN
INTRAVENOUS | Status: DC
  Filled 2023-10-12 (×3): qty 1000

## 2023-10-12 MED ORDER — PIPERACILLIN-TAZOBACTAM 3.375 G IVPB
3.3750 g | Freq: Three times a day (TID) | INTRAVENOUS | Status: DC
  Administered 2023-10-12 – 2023-10-14 (×6): 3.375 g via INTRAVENOUS
  Filled 2023-10-12 (×6): qty 50

## 2023-10-12 MED ORDER — POTASSIUM PHOSPHATES 15 MMOLE/5ML IV SOLN
15.0000 mmol | Freq: Once | INTRAVENOUS | Status: AC
  Administered 2023-10-12: 15 mmol via INTRAVENOUS
  Filled 2023-10-12: qty 5

## 2023-10-12 NOTE — Progress Notes (Addendum)
 PROGRESS NOTE    Whitney Murphy  JYN:[id number] DOB: 22-Jul-1970 DOA: 10/10/2023 PCP: Pcp, No  Chief Complaint  Patient presents with   Fatigue   Weakness    Brief Narrative:   53 yo with hx etoh abuse (chart history cirrhosis), HTN, depression, anxiety and multiple other medical issues here with abdominal pain, found to have pancreatitis.   Assessment & Plan:   Principal Problem:   Sepsis due to undetermined organism [facility]) Active Problems:   Macrocytosis   Thrombocytopenia (HCC)   Moderate protein malnutrition (HCC)   Abnormal LFTs   AKI (acute kidney injury) (HCC)   Tobacco abuse   Hypokalemia   Prolonged QT interval   Alcohol abuse   Alcohol induced acute pancreatitis   Hyponatremia   Abdominal pain   Nausea and vomiting   Leukocytosis   Lactic acidosis   Hypophosphatemia  Alcoholic Pancreatitis RUQ US  without cholelithiasis.  Hx supports alcoholic pancreatitis.   CT findings c/w pancreatitis.  Possible developing necrosis on imaging.  Not significantly TTP today, but she's significantly altered from mental status standpoint Continue IVF NPO for now (mostly due to AMS) Strict I/O, daily weights Low threshold for repeat imaging  Pain management, bowel regimen   Systemic Inflammatory Response Syndrome Fever, leukocytosis (fluctuating, up today), tachycardia - no clear infectious source, this could be inflammatory related to pancreatitis above CT abd with pancreatitis, possible necrosis (no fluid collections) For now on broad abx -> transition off cefepime  given her AMS, will use zosyn  for now UA bland Blood cultures NGx2 Consider additional imaging  Acute Metabolic Encephalopathy Complicated Alcohol Withdrawal   Delirium Tremens Head CT without acute abnormality High dose thiamine  B12 elevated, folate wnl, TSH wnl, ammonia wnl, RPR pending Tox with THC Continue ciwa with prn ativan  Concern for abuse of her seroquel  per son? Delirium  precautions  Cognitive Deficit Possibly related to chronic etoh use Plan for outpatient MRI at some point?  For now, delirium precautions and will manage acute issues. High dose thiamine  as above  Severe Hypokalemia Replace and follow   AKI Resolved  Hypocalcemia Corrects with albumin   Hypophosphatemia Follow and replace  Alcoholic Hepatitis Hepatitis Steatosis  Low maddrey's discriminant score Supportive care Acute hepatitis panel negative Chart history of cirrhosis, this isn't immediately clear to me - CT with steatosis, does have hypoalbuminemia, elevated bili, mild thrombocytopenia.  INR is wnl.  Will need outpatient follow up.  Prolonged Qtc Repeat EKG Caution with qt prolonging meds  Notably on seroquel  at home - son concerned she was abusing this     DVT prophylaxis: lovenox  Code Status: full Family Communication: none Disposition:   Status is: Inpatient Remains inpatient appropriate because: need for ongoing care   Consultants:  none  Procedures:  none  Antimicrobials:  Anti-infectives (From admission, onward)    Start     Dose/Rate Route Frequency Ordered Stop   10/11/23 0000  metroNIDAZOLE  (FLAGYL ) IVPB 500 mg        500 mg 100 mL/hr over 60 Minutes Intravenous Every 12 hours 10/10/23 1335 10/17/23 2359   10/10/23 2200  ceFEPIme  (MAXIPIME ) 2 g in sodium chloride  0.9 % 100 mL IVPB        2 g 200 mL/hr over 30 Minutes Intravenous Every 12 hours 10/10/23 1335     10/10/23 0930  ceFEPIme  (MAXIPIME ) 2 g in sodium chloride  0.9 % 100 mL IVPB        2 g 200 mL/hr over 30 Minutes Intravenous  Once 10/10/23  1610 10/10/23 1127   10/10/23 0930  metroNIDAZOLE  (FLAGYL ) IVPB 500 mg        500 mg 100 mL/hr over 60 Minutes Intravenous  Once 10/10/23 0916 10/10/23 1237       Subjective: Nonsensical speech   Objective: Vitals:   10/11/23 2052 10/11/23 2245 10/12/23 0439 10/12/23 0900  BP: (!) 133/95 135/80 133/86 (!) 110/91  Pulse: (!) 109 (!) 103  (!) 109 (!) 109  Resp: 20  20   Temp: 99.8 F (37.7 C)  (!) 97.5 F (36.4 C)   TempSrc: Oral  Axillary   SpO2: 97%  93%   Weight:      Height:        Intake/Output Summary (Last 24 hours) at 10/12/2023 1245 Last data filed at 10/12/2023 0900 Gross per 24 hour  Intake 3181.59 ml  Output 700 ml  Net 2481.59 ml   Filed Weights   10/10/23 1609  Weight: 51.6 kg    Examination:  General: No acute distress. Cardiovascular: RRR Lungs: unlabored Abdomen: soft, some TTP, but not epigastric or LUQ, more periumbilical  Neurological: delirious, initially asleep, but woke up with nonsensical speech Extremities: No clubbing or cyanosis. No edema.   Data Reviewed: I have personally reviewed following labs and imaging studies  CBC: Recent Labs  Lab 10/10/23 0914 10/11/23 0714 10/12/23 0755  WBC 19.5* 13.0* 20.0*  NEUTROABS 16.4* 10.4* 15.9*  HGB 12.5 9.5* 10.2*  HCT 37.7 28.2* 29.5*  MCV 103.6* 103.3* 100.7*  PLT 146* 134* 164    Basic Metabolic Panel: Recent Labs  Lab 10/10/23 1141 10/10/23 1650 10/11/23 0714 10/11/23 1113 10/11/23 1924 10/12/23 0755  NA 130*  --  134* 134* 133* 132*  K 2.7*  --  2.3* 3.0* 2.8* 3.5  CL 99  --  101 101 100 99  CO2 22  --  23 22 21* 23  GLUCOSE 102*  --  80 80 75 75  BUN 21*  --  10 10 7  <5*  CREATININE 1.54*  --  0.66 0.61 0.55 0.47  CALCIUM  7.5*  --  7.4* 7.6* 7.4* 7.8*  MG  --  1.8 2.3  --   --  1.9  PHOS  --  2.1* 2.0*  --   --  1.7*    GFR: Estimated Creatinine Clearance: 66.2 mL/min (by C-G formula based on SCr of 0.47 mg/dL).  Liver Function Tests: Recent Labs  Lab 10/10/23 1141 10/11/23 0714 10/12/23 0755  AST 280* 250* 203*  ALT 99* 83* 77*  ALKPHOS 162* 209* 241*  BILITOT 2.1* 1.9* 2.0*  PROT 6.0* 5.7* 6.3*  ALBUMIN 2.6* 2.4* 2.4*    CBG: No results for input(s): "GLUCAP" in the last 168 hours.   Recent Results (from the past 240 hours)  Resp panel by RT-PCR (RSV, Flu Dan Scearce&B, Covid) Anterior Nasal Swab      Status: None   Collection Time: 10/10/23  9:14 AM   Specimen: Anterior Nasal Swab  Result Value Ref Range Status   SARS Coronavirus 2 by RT PCR NEGATIVE NEGATIVE Final    Comment: (NOTE) SARS-CoV-2 target nucleic acids are NOT DETECTED.  The SARS-CoV-2 RNA is generally detectable in upper respiratory specimens during the acute phase of infection. The lowest concentration of SARS-CoV-2 viral copies this assay can detect is 138 copies/mL. Sema Stangler negative result does not preclude SARS-Cov-2 infection and should not be used as the sole basis for treatment or other patient management decisions. Dally Oshel negative result may occur with  improper specimen collection/handling, submission of specimen other than nasopharyngeal swab, presence of viral mutation(s) within the areas targeted by this assay, and inadequate number of viral copies(<138 copies/mL). Lilliam Chamblee negative result must be combined with clinical observations, patient history, and epidemiological information. The expected result is Negative.  Fact Sheet for Patients:  BloggerCourse.com  Fact Sheet for Healthcare Providers:  SeriousBroker.it  This test is no t yet approved or cleared by the [place]  FDA and  has been authorized for detection and/or diagnosis of SARS-CoV-2 by FDA under an Emergency Use Authorization (EUA). This EUA will remain  in effect (meaning this test can be used) for the duration of the COVID-19 declaration under Section 564(b)(1) of the Act, 21 U.S.C.section 360bbb-3(b)(1), unless the authorization is terminated  or revoked sooner.       Influenza Jezebel Pollet by PCR NEGATIVE NEGATIVE Final   Influenza B by PCR NEGATIVE NEGATIVE Final    Comment: (NOTE) The Xpert Xpress SARS-CoV-2/FLU/RSV plus assay is intended as an aid in the diagnosis of influenza from Nasopharyngeal swab specimens and should not be used as Raaga Maeder sole basis for treatment. Nasal washings and aspirates are  unacceptable for Xpert Xpress SARS-CoV-2/FLU/RSV testing.  Fact Sheet for Patients: BloggerCourse.com  Fact Sheet for Healthcare Providers: SeriousBroker.it  This test is not yet approved or cleared by the [place]  FDA and has been authorized for detection and/or diagnosis of SARS-CoV-2 by FDA under an Emergency Use Authorization (EUA). This EUA will remain in effect (meaning this test can be used) for the duration of the COVID-19 declaration under Section 564(b)(1) of the Act, 21 U.S.C. section 360bbb-3(b)(1), unless the authorization is terminated or revoked.     Resp Syncytial Virus by PCR NEGATIVE NEGATIVE Final    Comment: (NOTE) Fact Sheet for Patients: BloggerCourse.com  Fact Sheet for Healthcare Providers: SeriousBroker.it  This test is not yet approved or cleared by the [place]  FDA and has been authorized for detection and/or diagnosis of SARS-CoV-2 by FDA under an Emergency Use Authorization (EUA). This EUA will remain in effect (meaning this test can be used) for the duration of the COVID-19 declaration under Section 564(b)(1) of the Act, 21 U.S.C. section 360bbb-3(b)(1), unless the authorization is terminated or revoked.  Performed at [facility], [address], [place], [place] [address]   Blood Culture (routine x 2)     Status: None (Preliminary result)   Collection Time: 10/10/23  9:32 AM   Specimen: BLOOD  Result Value Ref Range Status   Specimen Description   Final    BLOOD LEFT ANTECUBITAL Performed at [facility], [address] [place], [place] [address]    Special Requests   Final    BOTTLES DRAWN AEROBIC AND ANAEROBIC Blood Culture results may not be optimal due to an inadequate volume of blood received in culture bottles Performed at [facility], [address] [place],  [place] [address]    Culture   Final    NO GROWTH 2 DAYS Performed at [facility], [address] [place], [place] [address]    Report Status PENDING  Incomplete  Blood Culture (routine x 2)     Status: None (Preliminary result)   Collection Time: 10/10/23  9:35 AM   Specimen: BLOOD  Result Value Ref Range Status   Specimen Description   Final    BLOOD RIGHT ANTECUBITAL Performed at [facility], [address], [place], [place] [address]    Special  Requests   Final    BOTTLES DRAWN AEROBIC AND ANAEROBIC Blood Culture adequate volume Performed at [facility], [address] [place], [place] [address]    Culture   Final    NO GROWTH 2 DAYS Performed at [facility], [address] [place], [place] [address]    Report Status PENDING  Incomplete         Radiology Studies: CT ABDOMEN PELVIS W CONTRAST Result Date: 10/10/2023 CLINICAL DATA:  Acute abdominal pain EXAM: CT ABDOMEN AND PELVIS WITH CONTRAST TECHNIQUE: Multidetector CT imaging of the abdomen and pelvis was performed using the standard protocol following bolus administration of intravenous contrast. RADIATION DOSE REDUCTION: This exam was performed according to the departmental dose-optimization program which includes automated exposure control, adjustment of the mA and/or kV according to patient size and/or use of iterative reconstruction technique. CONTRAST:  OMNIPAQUE  IOHEXOL  300 MG/ML  SOLN COMPARISON:  CT abdomen and pelvis 05/05/2018. FINDINGS: Lower chest: There is minimal patchy atelectasis in the lung bases. There is Roneshia Drew trace left pleural effusion. Hepatobiliary: There is diffuse fatty infiltration of the liver. No focal liver lesions are seen. Gallbladder and bile ducts are within normal limits. Pancreas: There is Lanette Ell moderate amount of fluid surrounding the body and tail the pancreas. There is Lorenza Shakir geographic area of decreased pancreatic attenuation in the tail without  discrete margins. No enhancing fluid collections are seen. There is no ductal dilatation. Spleen: Normal in size without focal abnormality. Adrenals/Urinary Tract: Adrenal glands are unremarkable. Kidneys are normal, without renal calculi, focal lesion, or hydronephrosis. Bladder is unremarkable. Stomach/Bowel: Stomach is within normal limits. No evidence of bowel wall thickening, distention, or inflammatory changes. The appendix is not seen. Vascular/Lymphatic: Aortic atherosclerosis. No enlarged abdominal or pelvic lymph nodes. Reproductive: Uterus and bilateral adnexa are unremarkable. Other: There is Luis Nickles small amount of free fluid in the pelvis and left upper quadrant. There is no free air or focal abdominal wall hernia. Musculoskeletal: No acute or significant osseous findings. IMPRESSION: 1. Findings compatible with acute pancreatitis. There is Creasie Lacosse geographic area of decreased pancreatic attenuation in the tail without discrete margins. This may represent developing necrosis. No enhancing fluid collections are seen. 2. Small amount of free fluid in the pelvis and left upper quadrant. 3. Trace left pleural effusion. 4. Fatty infiltration of the liver. 5. Aortic atherosclerosis. Aortic Atherosclerosis (ICD10-I70.0). Electronically Signed   By: Tyron Gallon M.D.   On: 10/10/2023 21:22   US  Abdomen Limited RUQ (LIVER/GB) Result Date: 10/10/2023 CLINICAL DATA:  Pancreatitis EXAM: ULTRASOUND ABDOMEN LIMITED RIGHT UPPER QUADRANT COMPARISON:  CT abdomen pelvis May 05, 2018 FINDINGS: Gallbladder: No gallstones or wall thickening visualized. No sonographic Murphy sign noted by sonographer. Common bile duct: Diameter: 5.9 mm Liver: Mildly coarsened echogenicity. No focal lesion. Central portal vein is patent with normal direction of flow. Within the peripheral aspect of the liver there is Ascher Schroepfer linear echogenic structure which is nonspecific. Possibility of peripheral portal vein with small amount of internal thrombus  not excluded. Other: None. IMPRESSION: 1. No cholelithiasis or sonographic evidence for acute cholecystitis. 2. Central portal vein is patent with normal direction of flow. Within the peripheral aspect of the liver there is Paislyn Domenico linear echogenic structure which is nonspecific. Possibility of peripheral portal vein with small amount of internal thrombus not excluded. Consider further evaluation with CT abdomen pelvis with contrast. 3. Hepatic steatosis. Electronically Signed   By: Jone Neither M.D.   On: 10/10/2023 13:10  Scheduled Meds:  enoxaparin  (LOVENOX ) injection  40 mg Subcutaneous Daily   folic acid   1 mg Oral Daily   LORazepam   0-4 mg Oral Q6H   Followed by   LORazepam   0-4 mg Oral Q12H   multivitamin with minerals  1 tablet Oral Daily   [START ON 10/19/2023] thiamine  (VITAMIN B1) injection  100 mg Intravenous Daily   thiamine   500 mg Intravenous Daily   Continuous Infusions:  ceFEPime  (MAXIPIME ) IV 2 g (10/12/23 1009)   metronidazole  500 mg (10/12/23 0008)   potassium PHOSPHATE IVPB (in mmol)     thiamine  (VITAMIN B1) injection 500 mg (10/12/23 0556)   Followed by   Cecily Cohen ON 10/14/2023] thiamine  (VITAMIN B1) injection       LOS: 2 days    Time spent: over 30 min    Donnetta Gains, MD [facility]   To contact the attending provider between 7A-7P or the covering provider during after hours 7P-7A, please log into the web site [email or web address] and access using universal [place] password for that web site. If you do not have the password, please call the hospital operator.  10/12/2023, 12:45 PM

## 2023-10-13 DIAGNOSIS — A419 Sepsis, unspecified organism: Secondary | ICD-10-CM | POA: Diagnosis not present

## 2023-10-13 LAB — COMPREHENSIVE METABOLIC PANEL WITH GFR
ALT: 59 U/L — ABNORMAL HIGH (ref 0–44)
AST: 141 U/L — ABNORMAL HIGH (ref 15–41)
Albumin: 2.1 g/dL — ABNORMAL LOW (ref 3.5–5.0)
Alkaline Phosphatase: 219 U/L — ABNORMAL HIGH (ref 38–126)
Anion gap: 13 (ref 5–15)
BUN: <5 mg/dL — ABNORMAL LOW (ref 6–20)
CO2: 21 mmol/L — ABNORMAL LOW (ref 22–32)
Calcium: 7.6 mg/dL — ABNORMAL LOW (ref 8.9–10.3)
Chloride: 98 mmol/L (ref 98–111)
Creatinine, Ser: 0.43 mg/dL — ABNORMAL LOW (ref 0.44–1.00)
GFR, Estimated: >60 mL/min (ref >60)
Glucose, Bld: 87 mg/dL (ref 70–99)
Potassium: 3.2 mmol/L — ABNORMAL LOW (ref 3.5–5.1)
Sodium: 132 mmol/L — ABNORMAL LOW (ref 135–145)
Total Bilirubin: 1.9 mg/dL — ABNORMAL HIGH (ref 0.0–1.2)
Total Protein: 6 g/dL — ABNORMAL LOW (ref 6.5–8.1)

## 2023-10-13 LAB — CBC WITH DIFFERENTIAL/PLATELET
Abs Immature Granulocytes: 0.54 10*3/uL — ABNORMAL HIGH (ref 0.00–0.07)
Basophils Absolute: 0.1 10*3/uL (ref 0.0–0.1)
Basophils Relative: 1 %
Eosinophils Absolute: 0.1 10*3/uL (ref 0.0–0.5)
Eosinophils Relative: 1 %
HCT: 29.3 % — ABNORMAL LOW (ref 36.0–46.0)
Hemoglobin: 10.1 g/dL — ABNORMAL LOW (ref 12.0–15.0)
Immature Granulocytes: 3 %
Lymphocytes Relative: 10 %
Lymphs Abs: 1.9 10*3/uL (ref 0.7–4.0)
MCH: 34.6 pg — ABNORMAL HIGH (ref 26.0–34.0)
MCHC: 34.5 g/dL (ref 30.0–36.0)
MCV: 100.3 fL — ABNORMAL HIGH (ref 80.0–100.0)
Monocytes Absolute: 3.2 10*3/uL — ABNORMAL HIGH (ref 0.1–1.0)
Monocytes Relative: 17 %
Neutro Abs: 13 10*3/uL — ABNORMAL HIGH (ref 1.7–7.7)
Neutrophils Relative %: 68 %
Platelets: 197 10*3/uL (ref 150–400)
RBC: 2.92 MIL/uL — ABNORMAL LOW (ref 3.87–5.11)
RDW: 13.8 % (ref 11.5–15.5)
WBC: 18.9 10*3/uL — ABNORMAL HIGH (ref 4.0–10.5)
nRBC: 0.1 % (ref 0.0–0.2)

## 2023-10-13 LAB — RPR: RPR Ser Ql: NONREACTIVE

## 2023-10-13 LAB — LIPASE, BLOOD: Lipase: 38 U/L (ref 11–51)

## 2023-10-13 LAB — MAGNESIUM: Magnesium: 1.8 mg/dL (ref 1.7–2.4)

## 2023-10-13 LAB — PHOSPHORUS: Phosphorus: 1.7 mg/dL — ABNORMAL LOW (ref 2.5–4.6)

## 2023-10-13 MED ORDER — POLYETHYLENE GLYCOL 3350 17 G PO PACK
17.0000 g | PACK | Freq: Two times a day (BID) | ORAL | Status: DC
  Administered 2023-10-13 (×2): 17 g via ORAL
  Filled 2023-10-13 (×2): qty 1

## 2023-10-13 MED ORDER — K PHOS MONO-SOD PHOS DI & MONO 155-852-130 MG PO TABS
500.0000 mg | ORAL_TABLET | Freq: Four times a day (QID) | ORAL | Status: AC
  Administered 2023-10-13 – 2023-10-15 (×7): 500 mg via ORAL
  Filled 2023-10-13 (×7): qty 2

## 2023-10-13 MED ORDER — MAGNESIUM OXIDE -MG SUPPLEMENT 400 (240 MG) MG PO TABS
400.0000 mg | ORAL_TABLET | Freq: Every day | ORAL | Status: DC
  Administered 2023-10-13 – 2023-10-15 (×3): 400 mg via ORAL
  Filled 2023-10-13 (×4): qty 1

## 2023-10-13 MED ORDER — POTASSIUM CHLORIDE 10 MEQ/100ML IV SOLN
10.0000 meq | INTRAVENOUS | Status: AC
  Administered 2023-10-13 (×4): 10 meq via INTRAVENOUS
  Filled 2023-10-13 (×2): qty 100

## 2023-10-13 NOTE — Progress Notes (Signed)
 Chaplains received a consult to provide Whitney Murphy with information about advance directives.  I attempted to meet with her, but she was asleep.

## 2023-10-13 NOTE — Progress Notes (Signed)
 PROGRESS NOTE    SHALISE ASPLIN  ZOX:[id number] DOB: Apr 12, 1971 DOA: 10/10/2023 PCP: Pcp, No  Chief Complaint  Patient presents with   Fatigue   Weakness    Brief Narrative:   53 yo with hx etoh abuse (chart history cirrhosis), HTN, depression, anxiety and multiple other medical issues here with abdominal pain, found to have pancreatitis.   Assessment & Plan:   Principal Problem:   Sepsis due to undetermined organism [facility]) Active Problems:   Macrocytosis   Thrombocytopenia (HCC)   Moderate protein malnutrition (HCC)   Abnormal LFTs   AKI (acute kidney injury) (HCC)   Tobacco abuse   Hypokalemia   Prolonged QT interval   Alcohol abuse   Alcohol induced acute pancreatitis   Hyponatremia   Abdominal pain   Nausea and vomiting   Leukocytosis   Lactic acidosis   Hypophosphatemia  Alcoholic Pancreatitis RUQ US  without cholelithiasis.  Hx supports alcoholic pancreatitis.   CT findings c/w pancreatitis.  Possible developing necrosis on imaging.  Not significantly TTP today Continue IVF NPO for now (mostly due to AMS -> hopefully can allow clears soon) Strict I/O, daily weights Low threshold for repeat imaging  Pain management, bowel regimen   Systemic Inflammatory Response Syndrome Fever, leukocytosis (fluctuating), tachycardia - no clear infectious source, this could be inflammatory related to pancreatitis above CT abd with pancreatitis, possible necrosis (no fluid collections) For now on broad abx -> zosyn  UA bland Blood cultures NGx3 Consider additional imaging  Acute Metabolic Encephalopathy Complicated Alcohol Withdrawal   Delirium Tremens Lethargy today, but more oriented - overall improving Head CT without acute abnormality High dose thiamine  B12 elevated, folate wnl, TSH wnl, ammonia wnl, RPR pending Tox with THC Continue ciwa with prn ativan  Concern for abuse of her seroquel  per son? Delirium precautions  Cognitive Deficit Possibly related to  chronic etoh use Plan for outpatient MRI at some point?  For now, delirium precautions and will manage acute issues. High dose thiamine  as above  Severe Hypokalemia Replace and follow   AKI Resolved  Hypocalcemia Corrects with albumin   Hypophosphatemia Follow and replace  Alcoholic Hepatitis Hepatitis Steatosis  Low maddrey's discriminant score Supportive care Acute hepatitis panel negative Chart history of cirrhosis, this isn't immediately clear to me - CT with steatosis, does have hypoalbuminemia, elevated bili, mild thrombocytopenia.  INR is wnl.  Will need outpatient follow up.  Prolonged Qtc Repeat EKG Caution with qt prolonging meds  Notably on seroquel  at home - son concerned she was abusing this     DVT prophylaxis: lovenox  Code Status: full Family Communication: none Disposition:   Status is: Inpatient Remains inpatient appropriate because: need for ongoing care   Consultants:  none  Procedures:  none  Antimicrobials:  Anti-infectives (From admission, onward)    Start     Dose/Rate Route Frequency Ordered Stop   10/12/23 1400  piperacillin -tazobactam (ZOSYN ) IVPB 3.375 g        3.375 g 12.5 mL/hr over 240 Minutes Intravenous Every 8 hours 10/12/23 1258     10/11/23 0000  metroNIDAZOLE  (FLAGYL ) IVPB 500 mg  Status:  Discontinued        500 mg 100 mL/hr over 60 Minutes Intravenous Every 12 hours 10/10/23 1335 10/12/23 1254   10/10/23 2200  ceFEPIme  (MAXIPIME ) 2 g in sodium chloride  0.9 % 100 mL IVPB  Status:  Discontinued        2 g 200 mL/hr over 30 Minutes Intravenous Every 12 hours 10/10/23 1335 10/12/23 1254  10/10/23 0930  ceFEPIme  (MAXIPIME ) 2 g in sodium chloride  0.9 % 100 mL IVPB        2 g 200 mL/hr over 30 Minutes Intravenous  Once 10/10/23 0916 10/10/23 1127   10/10/23 0930  metroNIDAZOLE  (FLAGYL ) IVPB 500 mg        500 mg 100 mL/hr over 60 Minutes Intravenous  Once 10/10/23 0916 10/10/23 1237       Subjective: Nonsensical  speech   Objective: Vitals:   10/12/23 1954 10/12/23 2039 10/13/23 0511 10/13/23 0614  BP: (!) 149/83 124/87 (!) 144/95 (!) 144/95  Pulse: (!) 111 (!) 104 (!) 109 (!) 109  Resp: (!) 21 18  19   Temp: 100.1 F (37.8 C)   98.2 F (36.8 C)  TempSrc: Oral   Oral  SpO2: 93%   95%  Weight:      Height:        Intake/Output Summary (Last 24 hours) at 10/13/2023 1047 Last data filed at 10/13/2023 0929 Gross per 24 hour  Intake 1425.49 ml  Output 550 ml  Net 875.49 ml   Filed Weights   10/10/23 1609  Weight: 51.6 kg    Examination:  General: No acute distress. Cardiovascular: RRR Lungs: unlabored Abdomen: soft, some TTP, but not epigastric or LUQ, more periumbilical  Neurological: delirious, initially asleep, but woke up with nonsensical speech Extremities: No clubbing or cyanosis. No edema.   Data Reviewed: I have personally reviewed following labs and imaging studies  CBC: Recent Labs  Lab 10/10/23 0914 10/11/23 0714 10/12/23 0755 10/13/23 0444  WBC 19.5* 13.0* 20.0* 18.9*  NEUTROABS 16.4* 10.4* 15.9* 13.0*  HGB 12.5 9.5* 10.2* 10.1*  HCT 37.7 28.2* 29.5* 29.3*  MCV 103.6* 103.3* 100.7* 100.3*  PLT 146* 134* 164 197    Basic Metabolic Panel: Recent Labs  Lab 10/10/23 1650 10/11/23 0714 10/11/23 1113 10/11/23 1924 10/12/23 0755 10/13/23 0444  NA  --  134* 134* 133* 132* 132*  K  --  2.3* 3.0* 2.8* 3.5 3.2*  CL  --  101 101 100 99 98  CO2  --  23 22 21* 23 21*  GLUCOSE  --  80 80 75 75 87  BUN  --  10 10 7  <5* <5*  CREATININE  --  0.66 0.61 0.55 0.47 0.43*  CALCIUM   --  7.4* 7.6* 7.4* 7.8* 7.6*  MG 1.8 2.3  --   --  1.9 1.8  PHOS 2.1* 2.0*  --   --  1.7* 1.7*    GFR: Estimated Creatinine Clearance: 66.2 mL/min (Johaan Ryser) (by C-G formula based on SCr of 0.43 mg/dL (L)).  Liver Function Tests: Recent Labs  Lab 10/10/23 1141 10/11/23 0714 10/12/23 0755 10/13/23 0444  AST 280* 250* 203* 141*  ALT 99* 83* 77* 59*  ALKPHOS 162* 209* 241* 219*  BILITOT  2.1* 1.9* 2.0* 1.9*  PROT 6.0* 5.7* 6.3* 6.0*  ALBUMIN 2.6* 2.4* 2.4* 2.1*    CBG: No results for input(s): "GLUCAP" in the last 168 hours.   Recent Results (from the past 240 hours)  Resp panel by RT-PCR (RSV, Flu Faun Mcqueen&B, Covid) Anterior Nasal Swab     Status: None   Collection Time: 10/10/23  9:14 AM   Specimen: Anterior Nasal Swab  Result Value Ref Range Status   SARS Coronavirus 2 by RT PCR NEGATIVE NEGATIVE Final    Comment: (NOTE) SARS-CoV-2 target nucleic acids are NOT DETECTED.  The SARS-CoV-2 RNA is generally detectable in upper respiratory specimens during the acute  phase of infection. The lowest concentration of SARS-CoV-2 viral copies this assay can detect is 138 copies/mL. Kline Bulthuis negative result does not preclude SARS-Cov-2 infection and should not be used as the sole basis for treatment or other patient management decisions. Ryan Ogborn negative result may occur with  improper specimen collection/handling, submission of specimen other than nasopharyngeal swab, presence of viral mutation(s) within the areas targeted by this assay, and inadequate number of viral copies(<138 copies/mL). Halley Kincer negative result must be combined with clinical observations, patient history, and epidemiological information. The expected result is Negative.  Fact Sheet for Patients:  BloggerCourse.com  Fact Sheet for Healthcare Providers:  SeriousBroker.it  This test is no t yet approved or cleared by the [place]  FDA and  has been authorized for detection and/or diagnosis of SARS-CoV-2 by FDA under an Emergency Use Authorization (EUA). This EUA will remain  in effect (meaning this test can be used) for the duration of the COVID-19 declaration under Section 564(b)(1) of the Act, 21 U.S.C.section 360bbb-3(b)(1), unless the authorization is terminated  or revoked sooner.       Influenza Janera Peugh by PCR NEGATIVE NEGATIVE Final   Influenza B by PCR NEGATIVE  NEGATIVE Final    Comment: (NOTE) The Xpert Xpress SARS-CoV-2/FLU/RSV plus assay is intended as an aid in the diagnosis of influenza from Nasopharyngeal swab specimens and should not be used as Seth Higginbotham sole basis for treatment. Nasal washings and aspirates are unacceptable for Xpert Xpress SARS-CoV-2/FLU/RSV testing.  Fact Sheet for Patients: BloggerCourse.com  Fact Sheet for Healthcare Providers: SeriousBroker.it  This test is not yet approved or cleared by the [place]  FDA and has been authorized for detection and/or diagnosis of SARS-CoV-2 by FDA under an Emergency Use Authorization (EUA). This EUA will remain in effect (meaning this test can be used) for the duration of the COVID-19 declaration under Section 564(b)(1) of the Act, 21 U.S.C. section 360bbb-3(b)(1), unless the authorization is terminated or revoked.     Resp Syncytial Virus by PCR NEGATIVE NEGATIVE Final    Comment: (NOTE) Fact Sheet for Patients: BloggerCourse.com  Fact Sheet for Healthcare Providers: SeriousBroker.it  This test is not yet approved or cleared by the [place]  FDA and has been authorized for detection and/or diagnosis of SARS-CoV-2 by FDA under an Emergency Use Authorization (EUA). This EUA will remain in effect (meaning this test can be used) for the duration of the COVID-19 declaration under Section 564(b)(1) of the Act, 21 U.S.C. section 360bbb-3(b)(1), unless the authorization is terminated or revoked.  Performed at [facility], [address], [place], [place] [address]   Blood Culture (routine x 2)     Status: None (Preliminary result)   Collection Time: 10/10/23  9:32 AM   Specimen: BLOOD  Result Value Ref Range Status   Specimen Description   Final    BLOOD LEFT ANTECUBITAL Performed at [facility], [address] [place], [place]  [address]    Special Requests   Final    BOTTLES DRAWN AEROBIC AND ANAEROBIC Blood Culture results may not be optimal due to an inadequate volume of blood received in culture bottles Performed at [facility], [address] [place], [place] [address]    Culture   Final    NO GROWTH 3 DAYS Performed at [facility], [address], [place], [place] [address]    Report Status PENDING  Incomplete  Blood Culture (routine x 2)     Status: None (Preliminary result)  Collection Time: 10/10/23  9:35 AM   Specimen: BLOOD  Result Value Ref Range Status   Specimen Description   Final    BLOOD RIGHT ANTECUBITAL Performed at [facility], [address] [place], [place] [address]    Special Requests   Final    BOTTLES DRAWN AEROBIC AND ANAEROBIC Blood Culture adequate volume Performed at [facility], [address], [place], [place] [address]    Culture   Final    NO GROWTH 3 DAYS Performed at [facility], [address] [place], [place] [address]    Report Status PENDING  Incomplete         Radiology Studies: No results found.       Scheduled Meds:  enoxaparin  (LOVENOX ) injection  40 mg Subcutaneous Daily   folic acid   1 mg Oral Daily   LORazepam   0-4 mg Oral Q12H   multivitamin with minerals  1 tablet Oral Daily   [START ON 10/19/2023] thiamine  (VITAMIN B1) injection  100 mg Intravenous Daily   thiamine   500 mg Intravenous Daily   Continuous Infusions:  lactated ringers  1,000 mL with potassium chloride  20 mEq infusion Stopped (10/13/23 0819)   piperacillin -tazobactam (ZOSYN )  IV 3.375 g (10/13/23 0512)   potassium chloride  10 mEq (10/13/23 1008)   thiamine  (VITAMIN B1) injection 500 mg (10/13/23 0513)   Followed by   Cecily Cohen ON 10/14/2023] thiamine  (VITAMIN B1) injection       LOS: 3 days    Time spent: over 30 min    Donnetta Gains, MD [facility]   To contact the attending provider  between 7A-7P or the covering provider during after hours 7P-7A, please log into the web site [email or web address] and access using universal [place] password for that web site. If you do not have the password, please call the hospital operator.  10/13/2023, 10:47 AM

## 2023-10-13 NOTE — TOC Initial Note (Signed)
 Transition of Care [facility]) - Initial/Assessment Note    Patient Details  Name: Whitney Murphy MRN: [id number] Date of Birth: [birth year]  [facility] CM/SW Contact:    Ruben Corolla, RN Phone Number: 10/13/2023, 11:28 AM  Clinical Narrative: Spoke to [name](Son). Patient w/DT's. D/c plan home. Added substance use resources to AVS.Has own transport home.                  Expected Discharge Plan: Home/Self Care Barriers to Discharge: Continued Medical Work up   Patient Goals and CMS Choice Patient states their goals for this hospitalization and ongoing recovery are:: Home CMS Medicare.gov Compare Post Acute Care list provided to:: Patient Represenative (must comment) ([name](son)) Choice offered to / list presented to : Adult Children [place] ownership interest in [facility]provided to:: Adult Children    Expected Discharge Plan and Services   Discharge Planning Services: CM Consult   Living arrangements for the past 2 months: Single Family Home                                      Prior Living Arrangements/Services Living arrangements for the past 2 months: Single Family Home Lives with:: Adult Children Patient language and need for interpreter reviewed:: Yes Do you feel safe going back to the place where you live?: Yes      Need for Family Participation in Patient Care: Yes (Comment) Care giver support system in place?: Yes (comment)   Criminal Activity/Legal Involvement Pertinent to Current Situation/Hospitalization: No - Comment as needed  Activities of Daily Living   ADL Screening (condition at time of admission) Independently performs ADLs?: Yes (appropriate for developmental age) Is the patient deaf or have difficulty hearing?: No Does the patient have difficulty seeing, even when wearing glasses/contacts?: No Does the patient have difficulty concentrating, remembering, or making decisions?: No  Permission  Sought/Granted Permission sought to share information with : Case Manager Permission granted to share information with : Yes, Verbal Permission Granted  Share Information with NAME: Case Manager           Emotional Assessment Appearance:: Appears stated age            Admission diagnosis:  Hypokalemia [E87.6] AKI (acute kidney injury) (HCC) [N17.9] Sepsis due to undetermined organism (HCC) [A41.9] Alcohol-induced acute pancreatitis, unspecified complication status [K85.20] Patient Active Problem List   Diagnosis Date Noted   Sepsis due to undetermined organism (HCC) 10/10/2023   Macrocytosis 10/10/2023   Thrombocytopenia (HCC) 10/10/2023   Moderate protein malnutrition (HCC) 10/10/2023   Abnormal LFTs 10/10/2023   AKI (acute kidney injury) (HCC) 10/10/2023   Tobacco abuse 10/10/2023   Hypokalemia 10/10/2023   Prolonged QT interval 10/10/2023   Alcohol abuse 10/10/2023   Alcohol induced acute pancreatitis 10/10/2023   Hyponatremia 10/10/2023   Abdominal pain 10/10/2023   Nausea and vomiting 10/10/2023   Leukocytosis 10/10/2023   Lactic acidosis 10/10/2023   Hypophosphatemia 10/10/2023   Postoperative state 09/25/2021   Status post surgery 09/24/2021   Closed trimalleolar fracture of left ankle 09/20/2021   Well woman exam with routine gynecological exam 12/15/2018   Left breast lump 12/15/2018   Cellulitis 03/07/2014   PCP:  Pcp, No Pharmacy:   [id number], [place] - [address] [facility] [address] [place] [address] Phone: [phone] Fax: [phone]     Social  Drivers of Health (SDOH) Social History: SDOH Screenings   Food Insecurity: No Food Insecurity (10/10/2023)  Housing: Low Risk  (10/10/2023)  Transportation Needs: No Transportation Needs (10/10/2023)  Utilities: Not At Risk (10/10/2023)  Financial Resource Strain: Low Risk  (08/18/2023)   Received from [facility]  Tobacco Use: High Risk (10/10/2023)    SDOH Interventions:     Readmission Risk Interventions     No data to display

## 2023-10-13 NOTE — Plan of Care (Signed)

## 2023-10-14 DIAGNOSIS — A419 Sepsis, unspecified organism: Secondary | ICD-10-CM | POA: Diagnosis not present

## 2023-10-14 LAB — COMPREHENSIVE METABOLIC PANEL WITH GFR
ALT: 50 U/L — ABNORMAL HIGH (ref 0–44)
AST: 96 U/L — ABNORMAL HIGH (ref 15–41)
Albumin: 2.1 g/dL — ABNORMAL LOW (ref 3.5–5.0)
Alkaline Phosphatase: 219 U/L — ABNORMAL HIGH (ref 38–126)
Anion gap: 12 (ref 5–15)
BUN: <5 mg/dL — ABNORMAL LOW (ref 6–20)
CO2: 23 mmol/L (ref 22–32)
Calcium: 7.8 mg/dL — ABNORMAL LOW (ref 8.9–10.3)
Chloride: 97 mmol/L — ABNORMAL LOW (ref 98–111)
Creatinine, Ser: 0.47 mg/dL (ref 0.44–1.00)
GFR, Estimated: >60 mL/min (ref >60)
Glucose, Bld: 80 mg/dL (ref 70–99)
Potassium: 3.7 mmol/L (ref 3.5–5.1)
Sodium: 132 mmol/L — ABNORMAL LOW (ref 135–145)
Total Bilirubin: 1.7 mg/dL — ABNORMAL HIGH (ref 0.0–1.2)
Total Protein: 6.4 g/dL — ABNORMAL LOW (ref 6.5–8.1)

## 2023-10-14 LAB — CBC WITH DIFFERENTIAL/PLATELET
Abs Immature Granulocytes: 1.26 10*3/uL — ABNORMAL HIGH (ref 0.00–0.07)
Basophils Absolute: 0.2 10*3/uL — ABNORMAL HIGH (ref 0.0–0.1)
Basophils Relative: 1 %
Eosinophils Absolute: 0.1 10*3/uL (ref 0.0–0.5)
Eosinophils Relative: 1 %
HCT: 32 % — ABNORMAL LOW (ref 36.0–46.0)
Hemoglobin: 11.6 g/dL — ABNORMAL LOW (ref 12.0–15.0)
Immature Granulocytes: 7 %
Lymphocytes Relative: 14 %
Lymphs Abs: 2.4 10*3/uL (ref 0.7–4.0)
MCH: 35.4 pg — ABNORMAL HIGH (ref 26.0–34.0)
MCHC: 36.3 g/dL — ABNORMAL HIGH (ref 30.0–36.0)
MCV: 97.6 fL (ref 80.0–100.0)
Monocytes Absolute: 4.1 10*3/uL — ABNORMAL HIGH (ref 0.1–1.0)
Monocytes Relative: 23 %
Neutro Abs: 9.4 10*3/uL — ABNORMAL HIGH (ref 1.7–7.7)
Neutrophils Relative %: 54 %
Platelets: 249 10*3/uL (ref 150–400)
RBC: 3.28 MIL/uL — ABNORMAL LOW (ref 3.87–5.11)
RDW: 13.8 % (ref 11.5–15.5)
WBC: 17.4 10*3/uL — ABNORMAL HIGH (ref 4.0–10.5)
nRBC: 0 % (ref 0.0–0.2)

## 2023-10-14 LAB — PHOSPHORUS: Phosphorus: 2.9 mg/dL (ref 2.5–4.6)

## 2023-10-14 LAB — MAGNESIUM: Magnesium: 1.6 mg/dL — ABNORMAL LOW (ref 1.7–2.4)

## 2023-10-14 MED ORDER — DEXTROSE IN LACTATED RINGERS 5 % IV SOLN
INTRAVENOUS | Status: DC
  Filled 2023-10-14: qty 1000

## 2023-10-14 MED ORDER — ORAL CARE MOUTH RINSE: 15.0000 mL | OROMUCOSAL | Status: DC | PRN

## 2023-10-14 MED ORDER — LOPERAMIDE HCL 2 MG PO CAPS
2.0000 mg | ORAL_CAPSULE | ORAL | Status: DC | PRN
  Administered 2023-10-15 – 2023-10-16 (×3): 2 mg via ORAL
  Filled 2023-10-14 (×4): qty 1

## 2023-10-14 MED ORDER — POTASSIUM CHLORIDE CRYS ER 20 MEQ PO TBCR
40.0000 meq | EXTENDED_RELEASE_TABLET | Freq: Once | ORAL | Status: AC
  Administered 2023-10-14: 40 meq via ORAL
  Filled 2023-10-14: qty 2

## 2023-10-14 MED ORDER — MAGNESIUM SULFATE 2 GM/50ML IV SOLN
2.0000 g | Freq: Once | INTRAVENOUS | Status: AC
  Administered 2023-10-14: 2 g via INTRAVENOUS
  Filled 2023-10-14: qty 50
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
## 2023-10-14 NOTE — Progress Notes (Signed)
   10/14/23 0936  Spiritual Encounters  Type of Visit Attempt (pt unavailable)   Per spiritual consult request I attempted to visit with Shasta Deist. At time of visit, patient sleeping soundly. Will attempt follow up at earliest opportunity.  Please page for urgent support needs.  Margarito Dehaas L. Minetta Aly, M.Div [phone]
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
## 2023-10-14 NOTE — Progress Notes (Addendum)
   10/14/23 1518  Spiritual Encounters  Type of Visit Follow up  Care provided to: Patient  Reason for visit Advance directives  OnCall Visit No   Visited with patient to discuss HCPOA as per son's request.  Per patient her son is here HCPOA and they are not interested in file for another or changing it. What they want is a POA. The son was not present. Patient stated if anything needs to be discussed, talk to her son. He does all the talking.

## 2023-10-14 NOTE — Evaluation (Signed)
 Physical Therapy Evaluation Patient Details Name: Whitney Murphy MRN: [id number] DOB: 1971/05/11 Today's Date: 10/14/2023  History of Present Illness  53 yo female admitted with SIRS, acute met encephalopathy, DTs/WD, ETOH pancreatitis. Hx of ankle fx s/p ORIF 09/24/21, ETOH abuse, memory issues, depression, anxiety  Clinical Impression  On eval, pt was CGA for safe mobility. She walked to/from bathroom with RW. Pt incontinent during session-did not seem to be aware. Poor safety awareness in general. No family present during session. Will plan to follow pt during hospital stay. Recommend HHPT f/u . Recommend 24/7 supervision at home until pt returns to her baseline.         If plan is discharge home, recommend the following: Supervision due to cognitive status;A little help with walking and/or transfers;A little help with bathing/dressing/bathroom   Can travel by private vehicle        Equipment Recommendations Rolling walker (2 wheels) (if pt doesn't already have one)  Recommendations for Other Services  OT consult    Functional Status Assessment Patient has had a recent decline in their functional status and demonstrates the ability to make significant improvements in function in a reasonable and predictable amount of time.     Precautions / Restrictions Precautions Precautions: Fall Precaution/Restrictions Comments: incontinent Restrictions Weight Bearing Restrictions Per Provider Order: No      Mobility  Bed Mobility Overal bed mobility: Needs Assistance Bed Mobility: Supine to Sit, Sit to Supine     Supine to sit: Supervision, HOB elevated, Used rails Sit to supine: Supervision, HOB elevated   General bed mobility comments: Supv for safety, lines. Pt with no regard for IV lines when getting back into bed    Transfers Overall transfer level: Needs assistance Equipment used: Rolling walker (2 wheels) Transfers: Sit to/from Stand Sit to Stand: Contact guard assist            General transfer comment: Cues for safety.    Ambulation/Gait Ambulation/Gait assistance: Contact guard assist Gait Distance (Feet): 12 Feet (x2) Assistive device: Rolling walker (2 wheels)         General Gait Details: CGA for safety. Did ok with RW. Cues for safety. Incontinent so did not leave room  Stairs            Wheelchair Mobility     Tilt Bed    Modified Rankin (Stroke Patients Only)       Balance Overall balance assessment: Needs assistance         Standing balance support: Bilateral upper extremity supported, During functional activity, Reliant on assistive device for balance Standing balance-Leahy Scale: Poor                               Pertinent Vitals/Pain Pain Assessment Pain Assessment: No/denies pain    Home Living Family/patient expects to be discharged to:: Private residence Living Arrangements: Children;Parent Available Help at Discharge: Family Type of Home: House Home Access: Ramped entrance       Home Layout: One level Home Equipment: Agricultural consultant (2 wheels);Rollator (4 wheels);Crutches;Shower seat;Hand held shower head;Grab bars - tub/shower;Cane - single point Additional Comments: some of info taken from previous admission    Prior Function Prior Level of Function : Independent/Modified Independent             Mobility Comments: uses cane for ambulation       Extremity/Trunk Assessment   Upper Extremity Assessment Upper Extremity Assessment: Defer to  OT evaluation    Lower Extremity Assessment Lower Extremity Assessment: Generalized weakness    Cervical / Trunk Assessment Cervical / Trunk Assessment: Normal  Communication   Communication Communication: No apparent difficulties    Cognition Arousal: Alert (but sleepy) Behavior During Therapy: WFL for tasks assessed/performed   PT - Cognitive impairments: Safety/Judgement, Problem solving                          Following commands: Intact (required some repetition)       Cueing Cueing Techniques: Verbal cues     General Comments      Exercises     Assessment/Plan    PT Assessment Patient needs continued PT services  PT Problem List Decreased strength;Decreased activity tolerance;Decreased balance;Decreased mobility;Decreased knowledge of use of DME;Decreased safety awareness       PT Treatment Interventions DME instruction;Gait training;Functional mobility training;Therapeutic activities;Therapeutic exercise;Patient/family education;Balance training    PT Goals (Current goals can be found in the Care Plan section)  Acute Rehab PT Goals Patient Stated Goal: to go back to bed and sleep PT Goal Formulation: With patient Time For Goal Achievement: 10/28/23 Potential to Achieve Goals: Good    Frequency Min 3X/week     Co-evaluation               AM-PAC PT "6 Clicks" Mobility  Outcome Measure Help needed turning from your back to your side while in a flat bed without using bedrails?: A Little Help needed moving from lying on your back to sitting on the side of a flat bed without using bedrails?: A Little Help needed moving to and from a bed to a chair (including a wheelchair)?: A Little Help needed standing up from a chair using your arms (e.g., wheelchair or bedside chair)?: A Little Help needed to walk in hospital room?: A Little Help needed climbing 3-5 steps with a railing? : A Lot 6 Click Score: 17    End of Session Equipment Utilized During Treatment: Gait belt Activity Tolerance: Patient tolerated treatment well Patient left: in bed;with call bell/phone within reach;with bed alarm set   PT Visit Diagnosis: Unsteadiness on feet (R26.81);History of falling (Z91.81);Difficulty in walking, not elsewhere classified (R26.2)    Time: 4098-1191 PT Time Calculation (min) (ACUTE ONLY): 19 min   Charges:   PT Evaluation $PT Eval Low Complexity: 1 Low   PT General  Charges $$ ACUTE PT VISIT: 1 Visit            Tanda Falter, PT Acute Rehabilitation  Office: [phone]

## 2023-10-15 ENCOUNTER — Inpatient Hospital Stay (HOSPITAL_COMMUNITY)

## 2023-10-15 DIAGNOSIS — A419 Sepsis, unspecified organism: Secondary | ICD-10-CM | POA: Diagnosis not present

## 2023-10-15 DIAGNOSIS — E876 Hypokalemia: Secondary | ICD-10-CM

## 2023-10-15 DIAGNOSIS — N179 Acute kidney failure, unspecified: Secondary | ICD-10-CM

## 2023-10-15 DIAGNOSIS — K852 Alcohol induced acute pancreatitis without necrosis or infection: Secondary | ICD-10-CM

## 2023-10-15 LAB — COMPREHENSIVE METABOLIC PANEL WITH GFR
ALT: 39 U/L (ref 0–44)
AST: 80 U/L — ABNORMAL HIGH (ref 15–41)
Albumin: 2.1 g/dL — ABNORMAL LOW (ref 3.5–5.0)
Alkaline Phosphatase: 177 U/L — ABNORMAL HIGH (ref 38–126)
Anion gap: 8 (ref 5–15)
BUN: <5 mg/dL — ABNORMAL LOW (ref 6–20)
CO2: 24 mmol/L (ref 22–32)
Calcium: 7.5 mg/dL — ABNORMAL LOW (ref 8.9–10.3)
Chloride: 96 mmol/L — ABNORMAL LOW (ref 98–111)
Creatinine, Ser: 0.4 mg/dL — ABNORMAL LOW (ref 0.44–1.00)
GFR, Estimated: >60 mL/min (ref >60)
Glucose, Bld: 136 mg/dL — ABNORMAL HIGH (ref 70–99)
Potassium: 2.8 mmol/L — ABNORMAL LOW (ref 3.5–5.1)
Sodium: 128 mmol/L — ABNORMAL LOW (ref 135–145)
Total Bilirubin: 0.8 mg/dL (ref 0.0–1.2)
Total Protein: 5.9 g/dL — ABNORMAL LOW (ref 6.5–8.1)

## 2023-10-15 LAB — CBC WITH DIFFERENTIAL/PLATELET
Abs Immature Granulocytes: 1.4 10*3/uL — ABNORMAL HIGH (ref 0.00–0.07)
Basophils Absolute: 0.2 10*3/uL — ABNORMAL HIGH (ref 0.0–0.1)
Basophils Relative: 1 %
Eosinophils Absolute: 0.1 10*3/uL (ref 0.0–0.5)
Eosinophils Relative: 1 %
HCT: 28.1 % — ABNORMAL LOW (ref 36.0–46.0)
Hemoglobin: 9.6 g/dL — ABNORMAL LOW (ref 12.0–15.0)
Immature Granulocytes: 8 %
Lymphocytes Relative: 14 %
Lymphs Abs: 2.6 10*3/uL (ref 0.7–4.0)
MCH: 34.8 pg — ABNORMAL HIGH (ref 26.0–34.0)
MCHC: 34.2 g/dL (ref 30.0–36.0)
MCV: 101.8 fL — ABNORMAL HIGH (ref 80.0–100.0)
Monocytes Absolute: 3.8 10*3/uL — ABNORMAL HIGH (ref 0.1–1.0)
Monocytes Relative: 21 %
Neutro Abs: 10.2 10*3/uL — ABNORMAL HIGH (ref 1.7–7.7)
Neutrophils Relative %: 55 %
Platelets: 379 10*3/uL (ref 150–400)
RBC: 2.76 MIL/uL — ABNORMAL LOW (ref 3.87–5.11)
RDW: 14.5 % (ref 11.5–15.5)
WBC: 18.3 10*3/uL — ABNORMAL HIGH (ref 4.0–10.5)
nRBC: 0.2 % (ref 0.0–0.2)

## 2023-10-15 LAB — CULTURE, BLOOD (ROUTINE X 2)
Culture: NO GROWTH
Culture: NO GROWTH
Special Requests: ADEQUATE

## 2023-10-15 LAB — PHOSPHORUS: Phosphorus: 2.9 mg/dL (ref 2.5–4.6)

## 2023-10-15 LAB — MAGNESIUM: Magnesium: 1.9 mg/dL (ref 1.7–2.4)

## 2023-10-15 MED ORDER — IOHEXOL 300 MG/ML  SOLN
100.0000 mL | Freq: Once | INTRAMUSCULAR | Status: AC | PRN
  Administered 2023-10-15: 100 mL via INTRAVENOUS

## 2023-10-15 MED ORDER — POTASSIUM CHLORIDE CRYS ER 20 MEQ PO TBCR
40.0000 meq | EXTENDED_RELEASE_TABLET | ORAL | Status: AC
  Administered 2023-10-15 (×2): 40 meq via ORAL
  Filled 2023-10-15 (×2): qty 2

## 2023-10-15 MED ORDER — SODIUM CHLORIDE 0.9 % IV SOLN: INTRAVENOUS | Status: AC

## 2023-10-15 MED ORDER — POTASSIUM CHLORIDE 10 MEQ/100ML IV SOLN
10.0000 meq | INTRAVENOUS | Status: AC
  Administered 2023-10-15 (×2): 10 meq via INTRAVENOUS
  Filled 2023-10-15: qty 100

## 2023-10-15 NOTE — Progress Notes (Signed)
 Physical Therapy Treatment Patient Details Name: Whitney Murphy MRN: [id number] DOB: July 10, 1970 Today's Date: 10/15/2023   History of Present Illness 53 yo female admitted on 10/15/23 with SIRS, acute met encephalopathy, DTs/WD, ETOH pancreatitis. Hx of ankle fx s/p ORIF 09/24/21, ETOH abuse, memory issues, depression, anxiety    PT Comments  Pt able to progress to 200' ambulation today with CGA and no AD.  She was able to accept mild challenges of head turns, changing direction, and stops.  Pt did fatigue easily and requested return to bed after walk.  Will continue POC.  Do recommend initial supervision at d/c but likely no further PT needs.     If plan is discharge home, recommend the following: Supervision due to cognitive status;A little help with walking and/or transfers;A little help with bathing/dressing/bathroom   Can travel by private vehicle        Equipment Recommendations  Rolling walker (2 wheels)    Recommendations for Other Services       Precautions / Restrictions Precautions Precautions: Fall     Mobility  Bed Mobility Overal bed mobility: Needs Assistance Bed Mobility: Supine to Sit, Sit to Supine     Supine to sit: Supervision Sit to supine: Supervision        Transfers Overall transfer level: Needs assistance Equipment used: None Transfers: Sit to/from Stand Sit to Stand: Contact guard assist           General transfer comment: STS x 3 during session    Ambulation/Gait Ambulation/Gait assistance: Contact guard assist Gait Distance (Feet): 200 Feet Assistive device: None Gait Pattern/deviations: Step-through pattern, Decreased stride length Gait velocity: decreased but functional     General Gait Details: CGA for safety; some mild drifting R/L  with head turns but no LOB   Stairs             Wheelchair Mobility     Tilt Bed    Modified Rankin (Stroke Patients Only)       Balance Overall balance assessment: Needs  assistance Sitting-balance support: No upper extremity supported Sitting balance-Leahy Scale: Good     Standing balance support: No upper extremity supported Standing balance-Leahy Scale: Fair               High level balance activites: Direction changes, Turns, Sudden stops, Head turns High Level Balance Comments: Performed above with mild drifting but no overt LOB            Communication    Cognition Arousal: Alert Behavior During Therapy: WFL for tasks assessed/performed   PT - Cognitive impairments: Safety/Judgement, Problem solving                       PT - Cognition Comments: cues for safety and to wait for PT (trying to step over IV lines at one point in bathroom)        Cueing    Exercises      General Comments        Pertinent Vitals/Pain Pain Assessment Pain Assessment: No/denies pain    Home Living                          Prior Function            PT Goals (current goals can now be found in the care plan section) Progress towards PT goals: Progressing toward goals    Frequency    Min 3X/week  PT Plan      Co-evaluation              AM-PAC PT "6 Clicks" Mobility   Outcome Measure  Help needed turning from your back to your side while in a flat bed without using bedrails?: A Little Help needed moving from lying on your back to sitting on the side of a flat bed without using bedrails?: A Little Help needed moving to and from a bed to a chair (including a wheelchair)?: A Little Help needed standing up from a chair using your arms (e.g., wheelchair or bedside chair)?: A Little Help needed to walk in hospital room?: A Little Help needed climbing 3-5 steps with a railing? : A Little 6 Click Score: 18    End of Session Equipment Utilized During Treatment: Other (comment) Activity Tolerance: Patient tolerated treatment well;Other (comment) (Tolerated well but requesting return to bed after walk) Patient  left: in bed;with call bell/phone within reach;with bed alarm set (telesitter) Nurse Communication: Mobility status PT Visit Diagnosis: Unsteadiness on feet (R26.81);History of falling (Z91.81);Difficulty in walking, not elsewhere classified (R26.2)     Time: 9604-5409 PT Time Calculation (min) (ACUTE ONLY): 10 min  Charges:    $Gait Training: 8-22 mins PT General Charges $$ ACUTE PT VISIT: 1 Visit                     Cyd Dowse, PT Acute Rehab Services [facility] [phone]    Carolynn Citrin 10/15/2023, 4:00 PM

## 2023-10-15 NOTE — Progress Notes (Addendum)
 [facility]                                                                              Whitney Murphy, is a 53 y.o. female, DOB - 01/07/1971, XLK:[id number] Admit date - 10/10/2023    Outpatient Primary MD for the patient is Pcp, No  LOS - 5  days  Chief Complaint  Patient presents with   Fatigue   Weakness       Brief summary   Patient is a 53 year old female with history of etoh abuse (chart history cirrhosis), HTN, depression, anxiety and multiple other medical issues here with abdominal pain, found to have pancreatitis. Hospitalization complicated by complicated alcohol withdrawal and delirium.    Assessment & Plan    Acute alcoholic pancreatitis - RUQ US  without cholelithiasis.  CT findings consistent with pancreatitis and possibly developing necrosis. - Still has mild epigastric pain, continue IV fluids - Diet advanced to heart healthy diet - Will recheck CT abdomen given leukocytosis - Continue pain control, bowel regimen, counseled on alcohol cessation.     Systemic Inflammatory Response Syndrome, POA -Presented with fever, leukocytosis, tachycardia could be secondary to acute pancreatitis -CT abd 5/2 with acute pancreatitis, possible necrosis (no fluid collections) -Blood cultures negative, - Antibiotics have been continued, UA negative    Acute Metabolic Encephalopathy Complicated Alcohol Withdrawal   Delirium Tremens -Improving - Receiving high-dose IV thiamine  - B12 elevated, folate, TSH, ammonia level within normal limits.  RPR negative. - Patient was placed on CIWA with as needed Ativan . - Concern for abuse of her seroquel  per son? (She denied taking more than prescribed) - Delirium precautions, outpatient MRI at some point   Alcoholic Hepatitis Hepatitis Steatosis  - Low maddrey's discriminant score -Acute hepatitis panel negative -Will need outpatient follow-up with GI  Sinus Tach Monitor, related to above acute issues      Severe Hypokalemia -K2.8 replaced Magnesium  1.9   AKI Resolved   Hypocalcemia Corrects with albumin    Hypophosphatemia Continue replacement and   Prolonged Qtc -QTc 557 -Avoid QT prolonging meds  Notably on seroquel  at home - son concerned she was abusing this - this is on hold   Estimated body mass index is 19.53 kg/m as calculated from the following:   Height as of this encounter: 5\' 4"  (1.626 m).   Weight as of this encounter: 51.6 kg.  Code Status: Full code DVT Prophylaxis:  enoxaparin  (LOVENOX ) injection 40 mg Start: 10/11/23 1030 SCDs Start: 10/10/23 1338   Level of Care: Level of care: Telemetry Family Communication: Updated patient's son on phone  Disposition Plan:      Remains inpatient appropriate: Await CT abdomen today, advance diet, if improving, will DC home tomorrow   Procedures:    Consultants:     Antimicrobials:   Anti-infectives (From admission, onward)    Start     Dose/Rate Route Frequency Ordered Stop   10/12/23 1400  piperacillin -tazobactam (ZOSYN ) IVPB 3.375 g  Status:  Discontinued        3.375 g 12.5 mL/hr over 240 Minutes Intravenous Every 8 hours 10/12/23 1258 10/14/23 1405   10/11/23 0000  metroNIDAZOLE  (  FLAGYL ) IVPB 500 mg  Status:  Discontinued        500 mg 100 mL/hr over 60 Minutes Intravenous Every 12 hours 10/10/23 1335 10/12/23 1254   10/10/23 2200  ceFEPIme  (MAXIPIME ) 2 g in sodium chloride  0.9 % 100 mL IVPB  Status:  Discontinued        2 g 200 mL/hr over 30 Minutes Intravenous Every 12 hours 10/10/23 1335 10/12/23 1254   10/10/23 0930  ceFEPIme  (MAXIPIME ) 2 g in sodium chloride  0.9 % 100 mL IVPB        2 g 200 mL/hr over 30 Minutes Intravenous  Once 10/10/23 0916 10/10/23 1127   10/10/23 0930  metroNIDAZOLE  (FLAGYL ) IVPB 500 mg        500 mg 100 mL/hr over 60 Minutes Intravenous  Once 10/10/23 0916 10/10/23 1237          Medications  enoxaparin  (LOVENOX ) injection  40 mg Subcutaneous Daily   folic acid   1  mg Oral Daily   magnesium  oxide  400 mg Oral Daily   multivitamin with minerals  1 tablet Oral Daily   phosphorus  500 mg Oral QID   polyethylene glycol  17 g Oral BID   potassium chloride   40 mEq Oral Q4H   [START ON 10/19/2023] thiamine  (VITAMIN B1) injection  100 mg Intravenous Daily      Subjective:   Whitney Murphy was seen and examined today.  Overall improving, still does not have much of an appetite, has epigastric abdominal pain which is improving.  No nausea vomiting, fevers.    Objective:   Vitals:   10/14/23 1700 10/14/23 2230 10/15/23 0450 10/15/23 0813  BP: 114/89 96/62 118/87 116/87  Pulse: (!) 110 62 (!) 113 (!) 103  Resp:   18 20  Temp: 98 F (36.7 C) 98 F (36.7 C) 98.4 F (36.9 C) 98.9 F (37.2 C)  TempSrc: Oral Oral  Axillary  SpO2: 100% 92% 95% 94%  Weight:      Height:        Intake/Output Summary (Last 24 hours) at 10/15/2023 1230 Last data filed at 10/15/2023 1200 Gross per 24 hour  Intake 1722.96 ml  Output --  Net 1722.96 ml     Wt Readings from Last 3 Encounters:  10/10/23 51.6 kg  12/31/22 52.6 kg  09/24/21 58.5 kg     Exam General: Alert and oriented x 3, NAD Cardiovascular: S1 S2 auscultated,  RRR Respiratory: Clear to auscultation bilaterally, no wheezing Gastrointestinal: Soft, mild epigastric TTP, ND, NBS  Ext: no pedal edema bilaterally Neuro: no neuro deficits Psych: Normal affect     Data Reviewed:  I have personally reviewed following labs    CBC Lab Results  Component Value Date   WBC 18.3 (H) 10/15/2023   RBC 2.76 (L) 10/15/2023   HGB 9.6 (L) 10/15/2023   HCT 28.1 (L) 10/15/2023   MCV 101.8 (H) 10/15/2023   MCH 34.8 (H) 10/15/2023   PLT 379 10/15/2023   MCHC 34.2 10/15/2023   RDW 14.5 10/15/2023   LYMPHSABS 2.6 10/15/2023   MONOABS 3.8 (H) 10/15/2023   EOSABS 0.1 10/15/2023   BASOSABS 0.2 (H) 10/15/2023     Last metabolic panel Lab Results  Component Value Date   NA 128 (L) 10/15/2023   K 2.8 (L)  10/15/2023   CL 96 (L) 10/15/2023   CO2 24 10/15/2023   BUN <5 (L) 10/15/2023   CREATININE 0.40 (L) 10/15/2023   GLUCOSE 136 (H) 10/15/2023  GFRNONAA >60 10/15/2023   GFRAA >60 05/05/2018   CALCIUM  7.5 (L) 10/15/2023   PHOS 2.9 10/15/2023   PROT 5.9 (L) 10/15/2023   ALBUMIN 2.1 (L) 10/15/2023   BILITOT 0.8 10/15/2023   ALKPHOS 177 (H) 10/15/2023   AST 80 (H) 10/15/2023   ALT 39 10/15/2023   ANIONGAP 8 10/15/2023    CBG (last 3)  No results for input(s): "GLUCAP" in the last 72 hours.    Coagulation Profile: Recent Labs  Lab 10/10/23 1650  INR 1.0     Radiology Studies: I have personally reviewed the imaging studies  No results found.     Bertram Brocks M.D. [facility] 10/15/2023, 12:30 PM  Available via Epic secure chat 7am-7pm After 7 pm, please refer to night coverage provider listed on amion.

## 2023-10-16 ENCOUNTER — Encounter (HOSPITAL_COMMUNITY): Payer: Self-pay | Admitting: Gastroenterology

## 2023-10-16 DIAGNOSIS — N179 Acute kidney failure, unspecified: Secondary | ICD-10-CM | POA: Diagnosis not present

## 2023-10-16 DIAGNOSIS — A419 Sepsis, unspecified organism: Secondary | ICD-10-CM | POA: Diagnosis not present

## 2023-10-16 DIAGNOSIS — E876 Hypokalemia: Secondary | ICD-10-CM | POA: Diagnosis not present

## 2023-10-16 DIAGNOSIS — K8591 Acute pancreatitis with uninfected necrosis, unspecified: Secondary | ICD-10-CM

## 2023-10-16 DIAGNOSIS — K852 Alcohol induced acute pancreatitis without necrosis or infection: Secondary | ICD-10-CM | POA: Diagnosis not present

## 2023-10-16 LAB — COMPREHENSIVE METABOLIC PANEL WITH GFR
ALT: 40 U/L (ref 0–44)
AST: 72 U/L — ABNORMAL HIGH (ref 15–41)
Albumin: 2.2 g/dL — ABNORMAL LOW (ref 3.5–5.0)
Alkaline Phosphatase: 151 U/L — ABNORMAL HIGH (ref 38–126)
Anion gap: 6 (ref 5–15)
BUN: <5 mg/dL — ABNORMAL LOW (ref 6–20)
CO2: 22 mmol/L (ref 22–32)
Calcium: 8.1 mg/dL — ABNORMAL LOW (ref 8.9–10.3)
Chloride: 106 mmol/L (ref 98–111)
Creatinine, Ser: 0.44 mg/dL (ref 0.44–1.00)
GFR, Estimated: >60 mL/min (ref >60)
Glucose, Bld: 101 mg/dL — ABNORMAL HIGH (ref 70–99)
Potassium: 4 mmol/L (ref 3.5–5.1)
Sodium: 134 mmol/L — ABNORMAL LOW (ref 135–145)
Total Bilirubin: 0.6 mg/dL (ref 0.0–1.2)
Total Protein: 6.2 g/dL — ABNORMAL LOW (ref 6.5–8.1)

## 2023-10-16 LAB — LIPASE, BLOOD: Lipase: 64 U/L — ABNORMAL HIGH (ref 11–51)

## 2023-10-16 LAB — CBC
HCT: 27.7 % — ABNORMAL LOW (ref 36.0–46.0)
Hemoglobin: 9 g/dL — ABNORMAL LOW (ref 12.0–15.0)
MCH: 34.5 pg — ABNORMAL HIGH (ref 26.0–34.0)
MCHC: 32.5 g/dL (ref 30.0–36.0)
MCV: 106.1 fL — ABNORMAL HIGH (ref 80.0–100.0)
Platelets: 502 10*3/uL — ABNORMAL HIGH (ref 150–400)
RBC: 2.61 MIL/uL — ABNORMAL LOW (ref 3.87–5.11)
RDW: 15.1 % (ref 11.5–15.5)
WBC: 17.6 10*3/uL — ABNORMAL HIGH (ref 4.0–10.5)
nRBC: 0.2 % (ref 0.0–0.2)

## 2023-10-16 MED ORDER — SODIUM CHLORIDE 0.9 % IV SOLN: INTRAVENOUS | Status: DC

## 2023-10-16 NOTE — Progress Notes (Signed)
 Physical Therapy Treatment Patient Details Name: Whitney Murphy MRN: [id number] DOB: 21-Feb-1971 Today's Date: 10/16/2023   History of Present Illness 53 yo female admitted on 10/15/23 with SIRS, acute met encephalopathy, DTs/WD, ETOH pancreatitis. Hx of ankle fx s/p ORIF 09/24/21, ETOH abuse, memory issues, depression, anxiety    PT Comments  Pt in bed, agreeable to session. She is now Mod I for bed mobility, able to come to stand without assistance, amb to bathroom and progresses to hallway maintaining supA, able to complete rapid and spontaneous directional changes at SBA. Returns to bed. MD follows up after session.     If plan is discharge home, recommend the following: Supervision due to cognitive status;A little help with walking and/or transfers;A little help with bathing/dressing/bathroom   Can travel by private vehicle        Equipment Recommendations  Rolling walker (2 wheels)    Recommendations for Other Services       Precautions / Restrictions Precautions Precautions: Fall Precaution/Restrictions Comments: incontinent Restrictions Weight Bearing Restrictions Per Provider Order: No     Mobility  Bed Mobility Overal bed mobility: Modified Independent Bed Mobility: Supine to Sit, Sit to Supine     Supine to sit: Modified independent (Device/Increase time) Sit to supine: Modified independent (Device/Increase time)   General bed mobility comments: inc time for bed mobility, able to sit upright without support and enter/exit bed without assist    Transfers Overall transfer level: Needs assistance Equipment used: None Transfers: Sit to/from Stand Sit to Stand: Supervision                Ambulation/Gait Ambulation/Gait assistance: Supervision Gait Distance (Feet): 225 Feet Assistive device: None Gait Pattern/deviations: Step-through pattern, Decreased stride length, Wide base of support Gait velocity: decreased but functional     General Gait Details: Pt  able to complete greater distance amb in hallway without physical assist or AD. Completes rapid directional changes without LOB with random cues to turn, well tolerated   Stairs             Wheelchair Mobility     Tilt Bed    Modified Rankin (Stroke Patients Only)       Balance Overall balance assessment: Needs assistance Sitting-balance support: No upper extremity supported Sitting balance-Leahy Scale: Normal     Standing balance support: No upper extremity supported Standing balance-Leahy Scale: Good                              Communication Communication Communication: No apparent difficulties  Cognition Arousal: Alert Behavior During Therapy: WFL for tasks assessed/performed   PT - Cognitive impairments: Safety/Judgement, Problem solving                         Following commands: Intact      Cueing Cueing Techniques: Verbal cues  Exercises      General Comments        Pertinent Vitals/Pain Pain Assessment Pain Assessment: No/denies pain    Home Living                          Prior Function            PT Goals (current goals can now be found in the care plan section) Acute Rehab PT Goals Patient Stated Goal: to go back to bed and sleep PT Goal Formulation: With patient Time  For Goal Achievement: 10/28/23 Potential to Achieve Goals: Good Progress towards PT goals: Progressing toward goals    Frequency    Min 2X/week      PT Plan      Co-evaluation              AM-PAC PT "6 Clicks" Mobility   Outcome Measure  Help needed turning from your back to your side while in a flat bed without using bedrails?: None Help needed moving from lying on your back to sitting on the side of a flat bed without using bedrails?: None Help needed moving to and from a bed to a chair (including a wheelchair)?: None Help needed standing up from a chair using your arms (e.g., wheelchair or bedside chair)?:  None Help needed to walk in hospital room?: A Little Help needed climbing 3-5 steps with a railing? : A Little 6 Click Score: 22    End of Session Equipment Utilized During Treatment: Gait belt Activity Tolerance: Patient tolerated treatment well Patient left: in bed;with call bell/phone within reach Nurse Communication: Mobility status PT Visit Diagnosis: Unsteadiness on feet (R26.81);History of falling (Z91.81);Difficulty in walking, not elsewhere classified (R26.2)     Time: 1610-9604 PT Time Calculation (min) (ACUTE ONLY): 16 min  Charges:    $Gait Training: 8-22 mins PT General Charges $$ ACUTE PT VISIT: 1 Visit                     Whitney Murphy, PT Acute Rehabilitation Services Office: [phone] 10/16/2023    Whitney Murphy 10/16/2023, 2:18 PM

## 2023-10-16 NOTE — Progress Notes (Signed)
 Occupational Therapy Treatment Patient Details Name: FRIMY YUH MRN: [id number] DOB: 1971/04/30 Today's Date: 10/16/2023   History of present illness 53 yr old female admitted on 10/15/23 with SIRS, acute met encephalopathy, ETOH pancreatitis. PMH: ankle fx s/p ORIF 09/24/21, ETOH abuse, memory issues, depression, anxiety   OT comments  The pt presented with much improved functional abilities and overall ADL performance today. She performed all tasks with supervision or better, including supine to sit, lower body dressing, sit to stand, simulated toileting, and ambulating into the hall without an assistive device. She has made functional progress during her hospital stay & has subsequently met her OT goals. OT addressed any questions and/or concerns she posed regarding her return home and her occupational participation in the home; she verbalized understanding. She does not require further OT services. OT will sign off.      If plan is discharge home, recommend the following:  Assist for transportation   Equipment Recommendations  None recommended by OT    Recommendations for Other Services      Precautions / Restrictions Restrictions Weight Bearing Restrictions Per Provider Order: No       Mobility Bed Mobility Overal bed mobility: Modified Independent Bed Mobility: Supine to Sit, Sit to Supine     Supine to sit: Modified independent (Device/Increase time), Used rails Sit to supine: Modified independent (Device/Increase time), Used rails        Transfers Overall transfer level: Needs assistance Equipment used: None Transfers: Sit to/from Stand Sit to Stand: Supervision                 Balance     Sitting balance-Leahy Scale: Normal       Standing balance-Leahy Scale: Good           ADL either performed or assessed with clinical judgement   ADL Overall ADL's : Modified independent;Independent;At baseline           Communication  Communication Communication: No apparent difficulties   Cognition Arousal: Alert Behavior During Therapy: WFL for tasks assessed/performed        Following commands: Intact                      Pertinent Vitals/ Pain       Pain Assessment Pain Assessment: No/denies pain   Frequency   (N/A)        Progress Toward Goals  OT Goals(current goals can now be found in the care plan section)  Progress towards OT goals: Goals met/education completed, patient discharged from OT  ADL Goals Pt Will Perform Lower Body Dressing: with supervision;sitting/lateral leans Pt Will Transfer to Toilet: with supervision;ambulating Pt Will Perform Toileting - Clothing Manipulation and hygiene: with supervision;sit to/from stand  Plan         AM-PAC OT "6 Clicks" Daily Activity     Outcome Measure   Help from another person eating meals?: None Help from another person taking care of personal grooming?: None Help from another person toileting, which includes using toliet, bedpan, or urinal?: None Help from another person bathing (including washing, rinsing, drying)?: None Help from another person to put on and taking off regular upper body clothing?: None Help from another person to put on and taking off regular lower body clothing?: None 6 Click Score: 24    End of Session Equipment Utilized During Treatment: Other (comment) (N/A)  OT Visit Diagnosis: Unsteadiness on feet (R26.81);Muscle weakness (generalized) (M62.81)   Activity Tolerance Patient tolerated treatment  well   Patient Left in bed;with call bell/phone within reach   Nurse Communication Other (comment)        Time: 1610-9604 OT Time Calculation (min): 12 min  Charges: OT General Charges $OT Visit: 1 Visit OT Treatments $Therapeutic Activity: 8-22 mins     Sheralyn Dies, OTR/L 10/16/2023, 5:37 PM

## 2023-10-16 NOTE — Progress Notes (Signed)
 [facility]                                                                              [name], is a 53 y.o. female, DOB - 02/02/1971, ZOX:[id number] Admit date - 10/10/2023    Outpatient Primary MD for the patient is Pcp, No  LOS - 6  days  Chief Complaint  Patient presents with   Fatigue   Weakness       Brief summary   Patient is a 53 year old female with history of etoh abuse (chart history cirrhosis), HTN, depression, anxiety and multiple other medical issues here with abdominal pain, found to have pancreatitis. Hospitalization complicated by complicated alcohol withdrawal and delirium.    Assessment & Plan    Acute alcoholic pancreatitis - RUQ US  without cholelithiasis.  CT findings consistent with pancreatitis and possibly developing necrosis. - Still has mild epigastric pain, continue IV fluids - Repeat CT abdomen showed large pancreatic pseudocyst 5.1x 10.3x 11.3 cm, pancreatic necrosis in the tail - GI consulted, d/w Dr Lavaughn [place], will follow recommendations if pancreatic pseudocyst needs drain, ?  Antibiotics - Per patient, she was able to tolerate heart healthy diet, will continue     Systemic Inflammatory Response Syndrome, POA -Presented with fever, leukocytosis, tachycardia could be secondary to acute pancreatitis -CT abd 5/2 with acute pancreatitis, possible necrosis (no fluid collections) however repeat CT abdomen on 5/7 showed large pancreatic pseudocyst with pancreatic necrosis in the tail -Blood cultures negative, - Will await GI evaluation for further management, drain, antibiotics?    Acute Metabolic Encephalopathy Complicated Alcohol Withdrawal   Delirium Tremens - Receiving high-dose IV thiamine  - B12 elevated, folate, TSH, ammonia level within normal limits.  RPR negative. - Patient was placed on CIWA with as needed Ativan . - Concern for abuse of her seroquel  per son? (She denied taking more than prescribed) - Delirium  precautions -Currently alert and oriented x 3, no acute issues   Alcoholic Hepatitis Hepatitis Steatosis  - Low maddrey's discriminant score -Acute hepatitis panel negative -Will need outpatient follow-up with GI  Sinus Tach Monitor, related to above acute issues     Hypokalemia - Replace as needed  AKI Resolved   Hypocalcemia Corrects with albumin    Hypophosphatemia Continue replacement and   Prolonged Qtc -QTc 557 -Avoid QT prolonging meds  Notably on seroquel  at home - son concerned she was abusing this - this is on hold   Estimated body mass index is 19.53 kg/m as calculated from the following:   Height as of this encounter: 5\' 4"  (1.626 m).   Weight as of this encounter: 51.6 kg.  Code Status: Full code DVT Prophylaxis:  enoxaparin  (LOVENOX ) injection 40 mg Start: 10/11/23 1030 SCDs Start: 10/10/23 1338   Level of Care: Level of care: Telemetry Family Communication: Updated patient's son on phone on 5/7 Disposition Plan:      Remains inpatient appropriate: Awaiting GI evaluation for further management   Procedures:    Consultants:   Gastroenterology  Antimicrobials:   Anti-infectives (From admission, onward)    Start     Dose/Rate Route Frequency Ordered Stop   10/12/23 1400  piperacillin -tazobactam (ZOSYN ) IVPB 3.375 g  Status:  Discontinued        3.375 g 12.5 mL/hr over 240 Minutes Intravenous Every 8 hours 10/12/23 1258 10/14/23 1405   10/11/23 0000  metroNIDAZOLE  (FLAGYL ) IVPB 500 mg  Status:  Discontinued        500 mg 100 mL/hr over 60 Minutes Intravenous Every 12 hours 10/10/23 1335 10/12/23 1254   10/10/23 2200  ceFEPIme  (MAXIPIME ) 2 g in sodium chloride  0.9 % 100 mL IVPB  Status:  Discontinued        2 g 200 mL/hr over 30 Minutes Intravenous Every 12 hours 10/10/23 1335 10/12/23 1254   10/10/23 0930  ceFEPIme  (MAXIPIME ) 2 g in sodium chloride  0.9 % 100 mL IVPB        2 g 200 mL/hr over 30 Minutes Intravenous  Once 10/10/23 0916  10/10/23 1127   10/10/23 0930  metroNIDAZOLE  (FLAGYL ) IVPB 500 mg        500 mg 100 mL/hr over 60 Minutes Intravenous  Once 10/10/23 0916 10/10/23 1237          Medications  enoxaparin  (LOVENOX ) injection  40 mg Subcutaneous Daily   folic acid   1 mg Oral Daily   multivitamin with minerals  1 tablet Oral Daily   [START ON 10/19/2023] thiamine  (VITAMIN B1) injection  100 mg Intravenous Daily      Subjective:   Whitney Murphy was seen and examined today.  Still has mild epigastric pain, states able to tolerate heart healthy diet.  No acute nausea vomiting, fever or chills. + low-grade temp of 99.1 F.    Objective:   Vitals:   10/15/23 2146 10/16/23 0500 10/16/23 1000 10/16/23 1058  BP: 113/79 (!) 122/95  124/89  Pulse: (!) 116 (!) 111  (!) 118  Resp: (!) 22  20 16   Temp: 99.1 F (37.3 C) 99.1 F (37.3 C)  98.6 F (37 C)  TempSrc:  Oral  Oral  SpO2: 99%   97%  Weight:      Height:        Intake/Output Summary (Last 24 hours) at 10/16/2023 1222 Last data filed at 10/16/2023 1051 Gross per 24 hour  Intake 2285.58 ml  Output --  Net 2285.58 ml     Wt Readings from Last 3 Encounters:  10/10/23 51.6 kg  12/31/22 52.6 kg  09/24/21 58.5 kg   Physical Exam General: Alert and oriented x 3, NAD Cardiovascular: S1 S2 clear, RRR.  Respiratory: CTAB Gastrointestinal: Soft, mild gastric TTP, ND, NBS  Ext: no pedal edema bilaterally Neuro: no new deficits Psych: Normal affect     Data Reviewed:  I have personally reviewed following labs    CBC Lab Results  Component Value Date   WBC 17.6 (H) 10/16/2023   RBC 2.61 (L) 10/16/2023   HGB 9.0 (L) 10/16/2023   HCT 27.7 (L) 10/16/2023   MCV 106.1 (H) 10/16/2023   MCH 34.5 (H) 10/16/2023   PLT 502 (H) 10/16/2023   MCHC 32.5 10/16/2023   RDW 15.1 10/16/2023   LYMPHSABS 2.6 10/15/2023   MONOABS 3.8 (H) 10/15/2023   EOSABS 0.1 10/15/2023   BASOSABS 0.2 (H) 10/15/2023     Last metabolic panel Lab Results   Component Value Date   NA 134 (L) 10/16/2023   K 4.0 10/16/2023   CL 106 10/16/2023   CO2 22 10/16/2023   BUN <5 (L) 10/16/2023   CREATININE 0.44 10/16/2023   GLUCOSE 101 (H) 10/16/2023   GFRNONAA >60 10/16/2023  GFRAA >60 05/05/2018   CALCIUM  8.1 (L) 10/16/2023   PHOS 2.9 10/15/2023   PROT 6.2 (L) 10/16/2023   ALBUMIN 2.2 (L) 10/16/2023   BILITOT 0.6 10/16/2023   ALKPHOS 151 (H) 10/16/2023   AST 72 (H) 10/16/2023   ALT 40 10/16/2023   ANIONGAP 6 10/16/2023    CBG (last 3)  No results for input(s): "GLUCAP" in the last 72 hours.    Coagulation Profile: Recent Labs  Lab 10/10/23 1650  INR 1.0     Radiology Studies: I have personally reviewed the imaging studies  CT ABDOMEN PELVIS W CONTRAST Result Date: 10/15/2023 CLINICAL DATA:  Acute pancreatitis.  Evaluate for necrosis. EXAM: CT ABDOMEN AND PELVIS WITH CONTRAST TECHNIQUE: Multidetector CT imaging of the abdomen and pelvis was performed using the standard protocol following bolus administration of intravenous contrast. RADIATION DOSE REDUCTION: This exam was performed according to the departmental dose-optimization program which includes automated exposure control, adjustment of the mA and/or kV according to patient size and/or use of iterative reconstruction technique. CONTRAST:  OMNIPAQUE  IOHEXOL  300 MG/ML  SOLN COMPARISON:  CT abdomen and pelvis 10/10/2023. FINDINGS: Lower chest: There are new small bilateral pleural effusions. There is atelectasis in the bilateral lung bases. Hepatobiliary: There is diffuse fatty infiltration of the liver. No focal liver lesions are seen. Gallbladder and bile ducts are within normal limits. Pancreas: There is fluid surrounding the pancreatic body and tail extending into the left upper abdominal quadrant surrounding the splenic flexure of the colon. This fluid now demonstrates diffuse rim enhancement. Overall dimensions are difficult to measure but are approximately 5.1 x 10.3 by 11.3  cm. Enhancement is a new finding. Mild fluid and stranding surrounding the pancreatic head has slightly decreased. There is patchy decreased attenuation in the pancreatic tail suspicious for pancreatic necrosis as seen on the prior study. There is no pancreatic ductal dilatation. Spleen: Normal in size without focal abnormality. Adrenals/Urinary Tract: Adrenal glands are unremarkable. Kidneys are normal, without renal calculi, focal lesion, or hydronephrosis. Bladder is unremarkable. Stomach/Bowel: There is some wall thickening of the colon where it is surrounded by a peripancreatic fluid collection. There is no evidence for bowel obstruction, pneumatosis or free air. The appendix is not visualized. The stomach and small bowel are nondilated. Vascular/Lymphatic: Aortic atherosclerosis. No enlarged abdominal or pelvic lymph nodes. Reproductive: Uterus and bilateral adnexa are unremarkable. Other: There is a small amount of free fluid in the left upper quadrant. Injection site air seen in the anterior abdomen. Musculoskeletal: No acute or significant osseous findings. IMPRESSION: 1. New rim enhancement of fluid surrounding the pancreatic body and tail extending into the left upper abdominal quadrant surrounding the splenic flexure of the colon. Findings are worrisome for pancreatic pseudocyst. 2. Stable decreased attenuation in the pancreatic tail suspicious for pancreatic necrosis as seen on the prior study. 3. Small amount of free fluid in the left upper quadrant. 4. New small bilateral pleural effusions. 5. Fatty infiltration of the liver. 6. Aortic atherosclerosis. Aortic Atherosclerosis (ICD10-I70.0). Electronically Signed   By: Tyron Gallon M.D.   On: 10/15/2023 23:11       Jameir Ake M.D. [facility] 10/16/2023, 12:22 PM  Available via Epic secure chat 7am-7pm After 7 pm, please refer to night coverage provider listed on amion.

## 2023-10-16 NOTE — Consult Note (Signed)
 Reason for Consult: Abnormal CT Referring Physician: Hospital team  Whitney Murphy is an 53 y.o. female.  HPI: Patient seen and examined in her hospital computer chart reviewed and her case discussed with the hospital team and Dr. Brice Campi this is her first bout of pancreatitis and she looks and feels much better than her CT scan and she admits to too much alcohol there is no family history of pancreatitis and prior to this she has had no GI issues and has not seen a gastroenterologist before and currently she is eating fine moving her bowels and walking in the halls without problems  Past Medical History:  Diagnosis Date   Anxiety    Arthritis    "neck" (03/07/2014)   Asthma    Cellulitis of axilla, left 03/07/2014   Cervical cancer (HCC)    Dr. Corbett Desanctis    Chronic ear infection    Depression    Fractured coccyx (HCC)    x2   GERD (gastroesophageal reflux disease)    High triglycerides    hx   History of kidney stones    Hypertension    hx   Nephrolithiasis     Past Surgical History:  Procedure Laterality Date   CERVICAL CONE BIOPSY  ~ 1999   just in lining, normal pap since    DILATION AND CURETTAGE OF UTERUS  1991   "miscarriage"   DILATION AND EVACUATION  ~ 2008   INCISION AND DRAINAGE ABSCESS Left 03/11/2014   Procedure: INCISION AND DRAINAGE ABSCESS;  Surgeon: Dorena Gander, MD;  Location: [facility] OR;  Service: General;  Laterality: Left;   ORIF ANKLE FRACTURE Left 09/24/2021   Procedure: OPEN REDUCTION INTERNAL FIXATION (ORIF) ANKLE;  Surgeon: Ali Ink, MD;  Location: [facility] OR;  Service: Orthopedics;  Laterality: Left;   SYNDESMOSIS REPAIR Left 09/24/2021   Procedure: SYNDESMOSIS REPAIR;  Surgeon: Ali Ink, MD;  Location: [facility] OR;  Service: Orthopedics;  Laterality: Left;   TONSILLECTOMY      Family History  Problem Relation Age of Onset   Depression Mother    Heart disease Father        stent put in   Diabetes Maternal Grandfather      Social History:  reports that she has been smoking cigarettes. She has a 24 pack-year smoking history. She has never used smokeless tobacco. She reports current alcohol use of about 6.0 standard drinks of alcohol per week. She reports current drug use. Drug: Marijuana.  Allergies: No Known Allergies  Medications: I have reviewed the patient's current medications.  Results for orders placed or performed during the hospital encounter of 10/10/23 (from the past 48 hours)  CBC with Differential/Platelet     Status: Abnormal   Collection Time: 10/15/23  5:18 AM  Result Value Ref Range   WBC 18.3 (H) 4.0 - 10.5 K/uL   RBC 2.76 (L) 3.87 - 5.11 MIL/uL   Hemoglobin 9.6 (L) 12.0 - 15.0 g/dL   HCT 78.2 (L) 95.6 - 21.3 %   MCV 101.8 (H) 80.0 - 100.0 fL   MCH 34.8 (H) 26.0 - 34.0 pg   MCHC 34.2 30.0 - 36.0 g/dL   RDW 08.6 57.8 - 46.9 %   Platelets 379 150 - 400 K/uL   nRBC 0.2 0.0 - 0.2 %   Neutrophils Relative % 55 %   Neutro Abs 10.2 (H) 1.7 - 7.7 K/uL   Lymphocytes Relative 14 %   Lymphs Abs 2.6 0.7 - 4.0 K/uL   Monocytes  Relative 21 %   Monocytes Absolute 3.8 (H) 0.1 - 1.0 K/uL   Eosinophils Relative 1 %   Eosinophils Absolute 0.1 0.0 - 0.5 K/uL   Basophils Relative 1 %   Basophils Absolute 0.2 (H) 0.0 - 0.1 K/uL   WBC Morphology DOHLE BODIES     Comment: Mild Left Shift (1-5% metas, occ myelo) TOXIC GRANULATION    Smear Review MORPHOLOGY UNREMARKABLE    Immature Granulocytes 8 %   Abs Immature Granulocytes 1.40 (H) 0.00 - 0.07 K/uL   Reactive, Benign Lymphocytes PRESENT    Polychromasia PRESENT    Target Cells PRESENT     Comment: Performed at [facility], [address], [place], [place] [address]  Comprehensive metabolic panel with GFR     Status: Abnormal   Collection Time: 10/15/23  5:18 AM  Result Value Ref Range   Sodium 128 (L) 135 - 145 mmol/L   Potassium 2.8 (L) 3.5 - 5.1 mmol/L   Chloride 96 (L) 98 - 111 mmol/L   CO2 24 22 - 32 mmol/L    Glucose, Bld 136 (H) 70 - 99 mg/dL    Comment: Glucose reference range applies only to samples taken after fasting for at least 8 hours.   BUN <5 (L) 6 - 20 mg/dL   Creatinine, Ser 9.56 (L) 0.44 - 1.00 mg/dL   Calcium  7.5 (L) 8.9 - 10.3 mg/dL   Total Protein 5.9 (L) 6.5 - 8.1 g/dL   Albumin 2.1 (L) 3.5 - 5.0 g/dL   AST 80 (H) 15 - 41 U/L   ALT 39 0 - 44 U/L   Alkaline Phosphatase 177 (H) 38 - 126 U/L   Total Bilirubin 0.8 0.0 - 1.2 mg/dL   GFR, Estimated >21 >30 mL/min    Comment: (NOTE) Calculated using the CKD-EPI Creatinine Equation (2021)    Anion gap 8 5 - 15    Comment: Performed at [facility], [address], [place], [place] [address]  Magnesium      Status: None   Collection Time: 10/15/23  5:18 AM  Result Value Ref Range   Magnesium  1.9 1.7 - 2.4 mg/dL    Comment: Performed at [facility], [address], [place], [place] [address]  Phosphorus     Status: None   Collection Time: 10/15/23  5:18 AM  Result Value Ref Range   Phosphorus 2.9 2.5 - 4.6 mg/dL    Comment: Performed at [facility], [address] [place], [place] [address]  CBC     Status: Abnormal   Collection Time: 10/16/23  5:09 AM  Result Value Ref Range   WBC 17.6 (H) 4.0 - 10.5 K/uL   RBC 2.61 (L) 3.87 - 5.11 MIL/uL   Hemoglobin 9.0 (L) 12.0 - 15.0 g/dL   HCT 13.2 (L) 44.0 - 10.2 %   MCV 106.1 (H) 80.0 - 100.0 fL   MCH 34.5 (H) 26.0 - 34.0 pg   MCHC 32.5 30.0 - 36.0 g/dL   RDW 72.5 36.6 - 44.0 %   Platelets 502 (H) 150 - 400 K/uL   nRBC 0.2 0.0 - 0.2 %    Comment: Performed at [facility], [address], [place], [place] [address]  Comprehensive metabolic panel     Status: Abnormal   Collection Time: 10/16/23  5:09 AM  Result Value Ref Range   Sodium 134 (L) 135 - 145 mmol/L   Potassium 4.0 3.5 - 5.1 mmol/L  Chloride 106 98 - 111 mmol/L   CO2 22 22 - 32 mmol/L   Glucose, Bld 101 (H) 70 - 99 mg/dL    Comment:  Glucose reference range applies only to samples taken after fasting for at least 8 hours.   BUN <5 (L) 6 - 20 mg/dL   Creatinine, Ser 1.61 0.44 - 1.00 mg/dL   Calcium  8.1 (L) 8.9 - 10.3 mg/dL   Total Protein 6.2 (L) 6.5 - 8.1 g/dL   Albumin 2.2 (L) 3.5 - 5.0 g/dL   AST 72 (H) 15 - 41 U/L   ALT 40 0 - 44 U/L   Alkaline Phosphatase 151 (H) 38 - 126 U/L   Total Bilirubin 0.6 0.0 - 1.2 mg/dL   GFR, Estimated >09 >60 mL/min    Comment: (NOTE) Calculated using the CKD-EPI Creatinine Equation (2021)    Anion gap 6 5 - 15    Comment: Performed at [facility], [address] [place], [place] [address]  Lipase, blood     Status: Abnormal   Collection Time: 10/16/23  6:40 AM  Result Value Ref Range   Lipase 64 (H) 11 - 51 U/L    Comment: Performed at [facility], [address] [place], [place] [address]    CT ABDOMEN PELVIS W CONTRAST Result Date: 10/15/2023 CLINICAL DATA:  Acute pancreatitis.  Evaluate for necrosis. EXAM: CT ABDOMEN AND PELVIS WITH CONTRAST TECHNIQUE: Multidetector CT imaging of the abdomen and pelvis was performed using the standard protocol following bolus administration of intravenous contrast. RADIATION DOSE REDUCTION: This exam was performed according to the departmental dose-optimization program which includes automated exposure control, adjustment of the mA and/or kV according to patient size and/or use of iterative reconstruction technique. CONTRAST:  OMNIPAQUE  IOHEXOL  300 MG/ML  SOLN COMPARISON:  CT abdomen and pelvis 10/10/2023. FINDINGS: Lower chest: There are new small bilateral pleural effusions. There is atelectasis in the bilateral lung bases. Hepatobiliary: There is diffuse fatty infiltration of the liver. No focal liver lesions are seen. Gallbladder and bile ducts are within normal limits. Pancreas: There is fluid surrounding the pancreatic body and tail extending into the left upper abdominal quadrant surrounding the  splenic flexure of the colon. This fluid now demonstrates diffuse rim enhancement. Overall dimensions are difficult to measure but are approximately 5.1 x 10.3 by 11.3 cm. Enhancement is a new finding. Mild fluid and stranding surrounding the pancreatic head has slightly decreased. There is patchy decreased attenuation in the pancreatic tail suspicious for pancreatic necrosis as seen on the prior study. There is no pancreatic ductal dilatation. Spleen: Normal in size without focal abnormality. Adrenals/Urinary Tract: Adrenal glands are unremarkable. Kidneys are normal, without renal calculi, focal lesion, or hydronephrosis. Bladder is unremarkable. Stomach/Bowel: There is some wall thickening of the colon where it is surrounded by a peripancreatic fluid collection. There is no evidence for bowel obstruction, pneumatosis or free air. The appendix is not visualized. The stomach and small bowel are nondilated. Vascular/Lymphatic: Aortic atherosclerosis. No enlarged abdominal or pelvic lymph nodes. Reproductive: Uterus and bilateral adnexa are unremarkable. Other: There is a small amount of free fluid in the left upper quadrant. Injection site air seen in the anterior abdomen. Musculoskeletal: No acute or significant osseous findings. IMPRESSION: 1. New rim enhancement of fluid surrounding the pancreatic body and tail extending into the left upper abdominal quadrant surrounding the splenic flexure of the colon. Findings are worrisome for pancreatic pseudocyst. 2. Stable decreased attenuation in the pancreatic tail  suspicious for pancreatic necrosis as seen on the prior study. 3. Small amount of free fluid in the left upper quadrant. 4. New small bilateral pleural effusions. 5. Fatty infiltration of the liver. 6. Aortic atherosclerosis. Aortic Atherosclerosis (ICD10-I70.0). Electronically Signed   By: Tyron Gallon M.D.   On: 10/15/2023 23:11    ROS negative except above Blood pressure 124/89, pulse (!) 118,  temperature 98.6 F (37 C), temperature source Oral, resp. rate 16, height 5\' 4"  (1.626 m), weight 51.6 kg, last menstrual period 03/22/2016, SpO2 97%. Physical Exam vital signs stable afebrile no acute distress abdomen is surprisingly soft nontender CT reviewed I actually thought I saw a small air bubble in the big cyst which is not fully developed and I ran the case by our EUS and drainage specialist Dr. Brice Campi who thinks it will need a little bit longer to proceed with internal drainage so I would recommend consulting IR to see if least aspiration to rule out significant infection is needed and then if so probably a drain I discussed that with the patient and her labs were reviewed and her liver tests are decreasing but her white count is about the same hemoglobin trending downward lipase only minimally up  Assessment/Plan: Pancreatitis with pseudocyst patient looks good Plan: We discussed alcohol cessation and some of the harbors of chronic pancreatitis or multiple recurrent admissions and will ask IR at their opinion on the current cyst and will check on tomorrow  Don Tiu E 10/16/2023, 1:55 PM

## 2023-10-16 NOTE — Progress Notes (Signed)
 Interventional Radiology Progress Note  53 yo female with admission for abdominal pain, with serial CT's showing evolving necrotizing pancreatitis and associated early pseudocyst.   VIR contacted about role for drainage.    Discussed with Dr. Thelma Fire.  Patient is currently improving, full diet now, with leukocytosis, low grade fever.  GI following.    At this time would defer drain attempt, given that the material is likely too viscous for aspiration, and that there is no clear reason to believe it is superinfected/abscess.   If the patient status deteriorates or if there is follow up imaging showing changes or a pseudocyst that cannot be drained with formal cyst-gastrostomy, VIR is available to reassess.   Call with questions/concerns.   Signed,  Marciano Settles. Mabel Savage, DO, ABVM, RPVI

## 2023-10-17 DIAGNOSIS — E876 Hypokalemia: Secondary | ICD-10-CM | POA: Diagnosis not present

## 2023-10-17 DIAGNOSIS — Z720 Tobacco use: Secondary | ICD-10-CM

## 2023-10-17 DIAGNOSIS — A419 Sepsis, unspecified organism: Secondary | ICD-10-CM | POA: Diagnosis not present

## 2023-10-17 DIAGNOSIS — K852 Alcohol induced acute pancreatitis without necrosis or infection: Secondary | ICD-10-CM | POA: Diagnosis not present

## 2023-10-17 LAB — COMPREHENSIVE METABOLIC PANEL WITH GFR
ALT: 39 U/L (ref 0–44)
AST: 70 U/L — ABNORMAL HIGH (ref 15–41)
Albumin: 2.4 g/dL — ABNORMAL LOW (ref 3.5–5.0)
Alkaline Phosphatase: 133 U/L — ABNORMAL HIGH (ref 38–126)
Anion gap: 11 (ref 5–15)
BUN: <5 mg/dL — ABNORMAL LOW (ref 6–20)
CO2: 19 mmol/L — ABNORMAL LOW (ref 22–32)
Calcium: 8.3 mg/dL — ABNORMAL LOW (ref 8.9–10.3)
Chloride: 104 mmol/L (ref 98–111)
Creatinine, Ser: 0.54 mg/dL (ref 0.44–1.00)
GFR, Estimated: >60 mL/min (ref >60)
Glucose, Bld: 88 mg/dL (ref 70–99)
Potassium: 3.9 mmol/L (ref 3.5–5.1)
Sodium: 134 mmol/L — ABNORMAL LOW (ref 135–145)
Total Bilirubin: 0.9 mg/dL (ref 0.0–1.2)
Total Protein: 6.4 g/dL — ABNORMAL LOW (ref 6.5–8.1)

## 2023-10-17 LAB — CBC
HCT: 30.9 % — ABNORMAL LOW (ref 36.0–46.0)
Hemoglobin: 10 g/dL — ABNORMAL LOW (ref 12.0–15.0)
MCH: 35.2 pg — ABNORMAL HIGH (ref 26.0–34.0)
MCHC: 32.4 g/dL (ref 30.0–36.0)
MCV: 108.8 fL — ABNORMAL HIGH (ref 80.0–100.0)
Platelets: 597 10*3/uL — ABNORMAL HIGH (ref 150–400)
RBC: 2.84 MIL/uL — ABNORMAL LOW (ref 3.87–5.11)
RDW: 15 % (ref 11.5–15.5)
WBC: 16.7 10*3/uL — ABNORMAL HIGH (ref 4.0–10.5)
nRBC: 0 % (ref 0.0–0.2)

## 2023-10-17 MED ORDER — THIAMINE HCL 100 MG PO TABS: 100.0000 mg | ORAL_TABLET | Freq: Every day | ORAL | 3 refills | Status: AC

## 2023-10-17 MED ORDER — ONDANSETRON HCL 8 MG PO TABS: 8.0000 mg | ORAL_TABLET | Freq: Three times a day (TID) | ORAL | 0 refills | Status: AC | PRN

## 2023-10-17 MED ORDER — ATENOLOL 25 MG PO TABS: 25.0000 mg | ORAL_TABLET | Freq: Every day | ORAL | 3 refills | Status: AC

## 2023-10-17 MED ORDER — MECLIZINE HCL 25 MG PO TABS: 25.0000 mg | ORAL_TABLET | Freq: Two times a day (BID) | ORAL | Status: AC | PRN

## 2023-10-17 MED ORDER — FOLIC ACID 1 MG PO TABS: 1.0000 mg | ORAL_TABLET | Freq: Every day | ORAL | 1 refills | Status: AC

## 2023-10-17 MED ORDER — OXYCODONE HCL 5 MG PO TABS: 5.0000 mg | ORAL_TABLET | Freq: Three times a day (TID) | ORAL | 0 refills | Status: AC | PRN

## 2023-10-17 MED ORDER — OMEPRAZOLE 20 MG PO CPDR: 20.0000 mg | DELAYED_RELEASE_CAPSULE | Freq: Every day | ORAL | 3 refills | Status: AC

## 2023-10-17 NOTE — Discharge Summary (Addendum)
 Physician Discharge Summary   Patient: Whitney Murphy MRN: [id number] DOB: 18-Apr-1971  Admit date:     10/10/2023  Discharge date: 10/17/23  Discharge Physician: Bertram Brocks, MD    PCP: Pcp, No   Recommendations at discharge:   Patient recommended to follow-up with gastroenterology, Dr. Lavaughn Portland or gastroenterologist [facility] within next 2-3  weeks.  She will need repeat CT abdomen to assess interval improvement of the pseudocyst  Discharge Diagnoses:    Systemic Inflammatory Response Syndrome, POA  Acute alcoholic pancreatitis with necrosis and developing pseudocyst Complicated alcohol withdrawal with delirium tremens Acute metabolic encephalopathy Alcoholic hepatitis, hepatitis steatosis with transaminitis   Macrocytosis   Thrombocytopenia (HCC)   Moderate protein malnutrition (HCC)   AKI (acute kidney injury) (HCC)   Tobacco abuse   Hypokalemia   Prolonged QT interval   Alcohol abuse   Hyponatremia   Leukocytosis   Lactic acidosis   Hypophosphatemia    Hospital Course:  Patient is a 53 year old female with history of etoh abuse (chart history cirrhosis), HTN, depression, anxiety and multiple other medical issues here with abdominal pain, found to have pancreatitis. Hospitalization complicated by complicated alcohol withdrawal and delirium.    Assessment and Plan:  Acute alcoholic pancreatitis - RUQ US  without cholelithiasis.  CT findings consistent with pancreatitis and possibly developing necrosis. -Patient was placed on n.p.o. status, IV fluids, pain control, antiemetics. - Repeat CT abdomen showed large pancreatic pseudocyst 5.1x 10.3x 11.3 cm, pancreatic necrosis in the tail - GI was consulted, seen by Dr. Lavaughn Portland who also discussed with interventional GI, Dr. Rexie Catena regarding the pancreatic pseudocyst.  IR was also consulted, per GI, IR, as patient is improving, recommended conservative management of the pancreatic pseudocyst and outpatient follow-up.   If patient's status deteriorates or follow-up imaging shows worsening of pseudocyst, needs drainage at that point.   - Currently tolerating solid diet, she was recommended to follow-up with GI in 2 to 4 weeks. LFTs improving     Systemic Inflammatory Response Syndrome, POA -Presented with fever, leukocytosis, tachycardia could be secondary to acute pancreatitis -CT abd 5/2 with acute pancreatitis, possible necrosis (no fluid collections) however repeat CT abdomen on 5/7 showed large pancreatic pseudocyst with pancreatic necrosis in the tail -Blood cultures negative, - Overall improving     Acute Metabolic Encephalopathy Complicated Alcohol Withdrawal   Delirium Tremens - Received high-dose IV thiamine , folate, MVI.   - B12 elevated, folate, TSH, ammonia level within normal limits.  RPR negative. - Patient was placed on CIWA with as needed Ativan . - Concern for abuse of her seroquel  per son? (She denied taking more than prescribed) - Delirium precautions -Currently alert and oriented x 3, no acute issues Continue thiamine , folic acid  at discharge   Alcoholic Hepatitis Hepatitis Steatosis  - Low maddrey's discriminant score -Acute hepatitis panel negative -Will need outpatient follow-up with GI   Sinus Tach Monitor, related to above acute issues, continue atenolol     AKI Resolved   Hypocalcemia Corrects with albumin    Hypophosphatemia -Received replacement   Prolonged Qtc -QTc 557 -Avoid QT prolonging meds  Notably on seroquel  at home - son concerned she was abusing this -Seroquel  was discontinued     Estimated body mass index is 19.53 kg/m as calculated from the following:   Height as of this encounter: 5\' 4"  (1.626 m).   Weight as of this encounter: 51.6 kg.        Pain control - [place]  Controlled Substance Reporting System database was reviewed.  and patient was instructed, not to drive, operate heavy machinery, perform activities at heights, swimming  or participation in water activities or provide baby-sitting services while on Pain, Sleep and Anxiety Medications; until their outpatient Physician has advised to do so again. Also recommended to not to take more than prescribed Pain, Sleep and Anxiety Medications.  Consultants: GI, IR Procedures performed: None Disposition: Home Diet recommendation:  Discharge Diet Orders (From admission, onward)     Start     Ordered   10/17/23 0000  Diet - low sodium heart healthy        10/17/23 1217            DISCHARGE MEDICATION: Allergies as of 10/17/2023   No Known Allergies      Medication List     PAUSE taking these medications    atorvastatin  40 MG tablet Wait to take this until your doctor or other care provider tells you to start again. Commonly known as: LIPITOR Take 40 mg by mouth at bedtime.       STOP taking these medications    aspirin  325 MG tablet   ibuprofen  800 MG tablet Commonly known as: ADVIL    mirtazapine  15 MG tablet Commonly known as: REMERON    oxyCODONE -acetaminophen  5-325 MG tablet Commonly known as: PERCOCET/ROXICET   QUEtiapine  50 MG tablet Commonly known as: SEROQUEL    sertraline  50 MG tablet Commonly known as: ZOLOFT        TAKE these medications    atenolol  25 MG tablet Commonly known as: TENORMIN  Take 1 tablet (25 mg total) by mouth daily.   cetirizine 10 MG tablet Commonly known as: ZYRTEC Take 10 mg by mouth daily.   diclofenac Sodium 1 % Gel Commonly known as: VOLTAREN Apply 2 g topically 3 (three) times a week.   docusate sodium  100 MG capsule Commonly known as: COLACE Take 100 mg by mouth 2 (two) times daily.   folic acid  1 MG tablet Commonly known as: FOLVITE  Take 1 tablet (1 mg total) by mouth daily. Start taking on: Oct 18, 2023   gabapentin  300 MG capsule Commonly known as: NEURONTIN  Take 300 mg by mouth 2 (two) times daily.   meclizine  25 MG tablet Commonly known as: ANTIVERT  Take 1 tablet (25 mg total)  by mouth 2 (two) times daily as needed for dizziness. What changed:  when to take this reasons to take this   NON FORMULARY Take 1 tablet by mouth See admin instructions. OraCoat XyliMelts Dry Mouth Relief- Dissolve 1 tablet in the mouth every 8 hours as needed for mouth dryness   omeprazole 20 MG capsule Commonly known as: PRILOSEC Take 1 capsule (20 mg total) by mouth daily.   ondansetron  8 MG tablet Commonly known as: ZOFRAN  Take 1 tablet (8 mg total) by mouth every 8 (eight) hours as needed for vomiting or nausea. What changed: reasons to take this   oxyCODONE  5 MG immediate release tablet Commonly known as: Oxy IR/ROXICODONE  Take 1 tablet (5 mg total) by mouth every 8 (eight) hours as needed for severe pain (pain score 7-10). What changed:  when to take this reasons to take this   thiamine  100 MG tablet Commonly known as: VITAMIN B1 Take 1 tablet (100 mg total) by mouth daily.   TURMERIC PO Take 1 capsule by mouth daily.        Follow-up Information     Ozell Blunt, MD. Schedule an appointment as soon as possible for a visit in 2 week(s).   Specialty:  Gastroenterology Why: for hospital follow-up Contact information: [address] [place] [address] [phone]         Nita Bast, NP. Schedule an appointment as soon as possible for a visit in 10 day(s).   Specialty: Nurse Practitioner Why: for hospital follow-up Contact information: [address] [place] [address] [place] [address] [phone]                Discharge Exam: Cleavon Curls Weights   10/10/23 1609  Weight: 51.6 kg   S: Feeling better today, tolerating diet without difficulty.  No nausea vomiting or worsening abdominal pain.  Cleared by GI to discharge home.   BP 113/73 (BP Location: Left Arm)   Pulse 98   Temp 99 F (37.2 C) (Oral)   Resp 18   Ht 5\' 4"  (1.626 m)   Wt 51.6 kg   LMP 03/22/2016   SpO2 98%   BMI 19.53 kg/m   Physical Exam General:  Alert and oriented x 3, NAD Cardiovascular: S1 S2 clear, RRR.  Respiratory: CTAB, no wheezing Gastrointestinal: Soft, nontender, nondistended, NBS Ext: no pedal edema bilaterally Neuro: no new deficits Psych: Normal affect    Condition at discharge: fair  The results of significant diagnostics from this hospitalization (including imaging, microbiology, ancillary and laboratory) are listed below for reference.   Imaging Studies: CT ABDOMEN PELVIS W CONTRAST Result Date: 10/15/2023 CLINICAL DATA:  Acute pancreatitis.  Evaluate for necrosis. EXAM: CT ABDOMEN AND PELVIS WITH CONTRAST TECHNIQUE: Multidetector CT imaging of the abdomen and pelvis was performed using the standard protocol following bolus administration of intravenous contrast. RADIATION DOSE REDUCTION: This exam was performed according to the departmental dose-optimization program which includes automated exposure control, adjustment of the mA and/or kV according to patient size and/or use of iterative reconstruction technique. CONTRAST:  OMNIPAQUE  IOHEXOL  300 MG/ML  SOLN COMPARISON:  CT abdomen and pelvis 10/10/2023. FINDINGS: Lower chest: There are new small bilateral pleural effusions. There is atelectasis in the bilateral lung bases. Hepatobiliary: There is diffuse fatty infiltration of the liver. No focal liver lesions are seen. Gallbladder and bile ducts are within normal limits. Pancreas: There is fluid surrounding the pancreatic body and tail extending into the left upper abdominal quadrant surrounding the splenic flexure of the colon. This fluid now demonstrates diffuse rim enhancement. Overall dimensions are difficult to measure but are approximately 5.1 x 10.3 by 11.3 cm. Enhancement is a new finding. Mild fluid and stranding surrounding the pancreatic head has slightly decreased. There is patchy decreased attenuation in the pancreatic tail suspicious for pancreatic necrosis as seen on the prior study. There is no pancreatic  ductal dilatation. Spleen: Normal in size without focal abnormality. Adrenals/Urinary Tract: Adrenal glands are unremarkable. Kidneys are normal, without renal calculi, focal lesion, or hydronephrosis. Bladder is unremarkable. Stomach/Bowel: There is some wall thickening of the colon where it is surrounded by a peripancreatic fluid collection. There is no evidence for bowel obstruction, pneumatosis or free air. The appendix is not visualized. The stomach and small bowel are nondilated. Vascular/Lymphatic: Aortic atherosclerosis. No enlarged abdominal or pelvic lymph nodes. Reproductive: Uterus and bilateral adnexa are unremarkable. Other: There is a small amount of free fluid in the left upper quadrant. Injection site air seen in the anterior abdomen. Musculoskeletal: No acute or significant osseous findings. IMPRESSION: 1. New rim enhancement of fluid surrounding the pancreatic body and tail extending into the left upper abdominal quadrant surrounding the splenic flexure of the colon. Findings are worrisome  for pancreatic pseudocyst. 2. Stable decreased attenuation in the pancreatic tail suspicious for pancreatic necrosis as seen on the prior study. 3. Small amount of free fluid in the left upper quadrant. 4. New small bilateral pleural effusions. 5. Fatty infiltration of the liver. 6. Aortic atherosclerosis. Aortic Atherosclerosis (ICD10-I70.0). Electronically Signed   By: Tyron Gallon M.D.   On: 10/15/2023 23:11   CT ABDOMEN PELVIS W CONTRAST Result Date: 10/10/2023 CLINICAL DATA:  Acute abdominal pain EXAM: CT ABDOMEN AND PELVIS WITH CONTRAST TECHNIQUE: Multidetector CT imaging of the abdomen and pelvis was performed using the standard protocol following bolus administration of intravenous contrast. RADIATION DOSE REDUCTION: This exam was performed according to the departmental dose-optimization program which includes automated exposure control, adjustment of the mA and/or kV according to patient size and/or  use of iterative reconstruction technique. CONTRAST:  OMNIPAQUE  IOHEXOL  300 MG/ML  SOLN COMPARISON:  CT abdomen and pelvis 05/05/2018. FINDINGS: Lower chest: There is minimal patchy atelectasis in the lung bases. There is a trace left pleural effusion. Hepatobiliary: There is diffuse fatty infiltration of the liver. No focal liver lesions are seen. Gallbladder and bile ducts are within normal limits. Pancreas: There is a moderate amount of fluid surrounding the body and tail the pancreas. There is a geographic area of decreased pancreatic attenuation in the tail without discrete margins. No enhancing fluid collections are seen. There is no ductal dilatation. Spleen: Normal in size without focal abnormality. Adrenals/Urinary Tract: Adrenal glands are unremarkable. Kidneys are normal, without renal calculi, focal lesion, or hydronephrosis. Bladder is unremarkable. Stomach/Bowel: Stomach is within normal limits. No evidence of bowel wall thickening, distention, or inflammatory changes. The appendix is not seen. Vascular/Lymphatic: Aortic atherosclerosis. No enlarged abdominal or pelvic lymph nodes. Reproductive: Uterus and bilateral adnexa are unremarkable. Other: There is a small amount of free fluid in the pelvis and left upper quadrant. There is no free air or focal abdominal wall hernia. Musculoskeletal: No acute or significant osseous findings. IMPRESSION: 1. Findings compatible with acute pancreatitis. There is a geographic area of decreased pancreatic attenuation in the tail without discrete margins. This may represent developing necrosis. No enhancing fluid collections are seen. 2. Small amount of free fluid in the pelvis and left upper quadrant. 3. Trace left pleural effusion. 4. Fatty infiltration of the liver. 5. Aortic atherosclerosis. Aortic Atherosclerosis (ICD10-I70.0). Electronically Signed   By: Tyron Gallon M.D.   On: 10/10/2023 21:22   US  Abdomen Limited RUQ (LIVER/GB) Result Date:  10/10/2023 CLINICAL DATA:  Pancreatitis EXAM: ULTRASOUND ABDOMEN LIMITED RIGHT UPPER QUADRANT COMPARISON:  CT abdomen pelvis May 05, 2018 FINDINGS: Gallbladder: No gallstones or wall thickening visualized. No sonographic Murphy sign noted by sonographer. Common bile duct: Diameter: 5.9 mm Liver: Mildly coarsened echogenicity. No focal lesion. Central portal vein is patent with normal direction of flow. Within the peripheral aspect of the liver there is a linear echogenic structure which is nonspecific. Possibility of peripheral portal vein with small amount of internal thrombus not excluded. Other: None. IMPRESSION: 1. No cholelithiasis or sonographic evidence for acute cholecystitis. 2. Central portal vein is patent with normal direction of flow. Within the peripheral aspect of the liver there is a linear echogenic structure which is nonspecific. Possibility of peripheral portal vein with small amount of internal thrombus not excluded. Consider further evaluation with CT abdomen pelvis with contrast. 3. Hepatic steatosis. Electronically Signed   By: Jone Neither M.D.   On: 10/10/2023 13:10   CT Head Wo Contrast Result Date: 10/10/2023  CLINICAL DATA:  Mental status change, weakness. EXAM: CT HEAD WITHOUT CONTRAST TECHNIQUE: Contiguous axial images were obtained from the base of the skull through the vertex without intravenous contrast. RADIATION DOSE REDUCTION: This exam was performed according to the departmental dose-optimization program which includes automated exposure control, adjustment of the mA and/or kV according to patient size and/or use of iterative reconstruction technique. COMPARISON:  CT head 05/19/2021. FINDINGS: Brain: No acute intracranial hemorrhage. No CT evidence of acute infarct. No edema, mass effect, or midline shift. The basilar cisterns are patent. Ventricles: The ventricles are normal. Vascular: No hyperdense vessel or unexpected calcification. Skull: No acute or aggressive finding.  Orbits: Orbits are symmetric. Sinuses: The visualized paranasal sinuses are clear. Other: Mastoid air cells are clear. IMPRESSION: No CT evidence of acute intracranial abnormality. Electronically Signed   By: Denny Flack M.D.   On: 10/10/2023 10:34   DG Chest Port 1 View Result Date: 10/10/2023 CLINICAL DATA:  Questionable sepsis - evaluate for abnormality. EXAM: PORTABLE CHEST 1 VIEW COMPARISON:  12/31/2022. FINDINGS: Bilateral lung fields are clear. Bilateral costophrenic angles are clear. Normal cardio-mediastinal silhouette. No acute osseous abnormalities. Subacute/healing posterior right eighth and ninth rib fractures noted. The soft tissues are within normal limits. IMPRESSION: No active disease. Electronically Signed   By: Beula Brunswick M.D.   On: 10/10/2023 09:50    Microbiology: Results for orders placed or performed during the hospital encounter of 10/10/23  Resp panel by RT-PCR (RSV, Flu A&B, Covid) Anterior Nasal Swab     Status: None   Collection Time: 10/10/23  9:14 AM   Specimen: Anterior Nasal Swab  Result Value Ref Range Status   SARS Coronavirus 2 by RT PCR NEGATIVE NEGATIVE Final    Comment: (NOTE) SARS-CoV-2 target nucleic acids are NOT DETECTED.  The SARS-CoV-2 RNA is generally detectable in upper respiratory specimens during the acute phase of infection. The lowest concentration of SARS-CoV-2 viral copies this assay can detect is 138 copies/mL. A negative result does not preclude SARS-Cov-2 infection and should not be used as the sole basis for treatment or other patient management decisions. A negative result may occur with  improper specimen collection/handling, submission of specimen other than nasopharyngeal swab, presence of viral mutation(s) within the areas targeted by this assay, and inadequate number of viral copies(<138 copies/mL). A negative result must be combined with clinical observations, patient history, and epidemiological information. The expected  result is Negative.  Fact Sheet for Patients:  BloggerCourse.com  Fact Sheet for Healthcare Providers:  SeriousBroker.it  This test is no t yet approved or cleared by the [place]  FDA and  has been authorized for detection and/or diagnosis of SARS-CoV-2 by FDA under an Emergency Use Authorization (EUA). This EUA will remain  in effect (meaning this test can be used) for the duration of the COVID-19 declaration under Section 564(b)(1) of the Act, 21 U.S.C.section 360bbb-3(b)(1), unless the authorization is terminated  or revoked sooner.       Influenza A by PCR NEGATIVE NEGATIVE Final   Influenza B by PCR NEGATIVE NEGATIVE Final    Comment: (NOTE) The Xpert Xpress SARS-CoV-2/FLU/RSV plus assay is intended as an aid in the diagnosis of influenza from Nasopharyngeal swab specimens and should not be used as a sole basis for treatment. Nasal washings and aspirates are unacceptable for Xpert Xpress SARS-CoV-2/FLU/RSV testing.  Fact Sheet for Patients: BloggerCourse.com  Fact Sheet for Healthcare Providers: SeriousBroker.it  This test is not yet approved or cleared by the [place]  FDA  and has been authorized for detection and/or diagnosis of SARS-CoV-2 by FDA under an Emergency Use Authorization (EUA). This EUA will remain in effect (meaning this test can be used) for the duration of the COVID-19 declaration under Section 564(b)(1) of the Act, 21 U.S.C. section 360bbb-3(b)(1), unless the authorization is terminated or revoked.     Resp Syncytial Virus by PCR NEGATIVE NEGATIVE Final    Comment: (NOTE) Fact Sheet for Patients: BloggerCourse.com  Fact Sheet for Healthcare Providers: SeriousBroker.it  This test is not yet approved or cleared by the [place]  FDA and has been authorized for detection and/or diagnosis of  SARS-CoV-2 by FDA under an Emergency Use Authorization (EUA). This EUA will remain in effect (meaning this test can be used) for the duration of the COVID-19 declaration under Section 564(b)(1) of the Act, 21 U.S.C. section 360bbb-3(b)(1), unless the authorization is terminated or revoked.  Performed at [facility], [address] [place], [place] [address]   Blood Culture (routine x 2)     Status: None   Collection Time: 10/10/23  9:32 AM   Specimen: BLOOD  Result Value Ref Range Status   Specimen Description   Final    BLOOD LEFT ANTECUBITAL Performed at [facility], [address] [place], [place] [address]    Special Requests   Final    BOTTLES DRAWN AEROBIC AND ANAEROBIC Blood Culture results may not be optimal due to an inadequate volume of blood received in culture bottles Performed at [facility], [address], [place], [place] [address]    Culture   Final    NO GROWTH 5 DAYS Performed at [facility], [address], [place], [place] [address]    Report Status 10/15/2023 FINAL  Final  Blood Culture (routine x 2)     Status: None   Collection Time: 10/10/23  9:35 AM   Specimen: BLOOD  Result Value Ref Range Status   Specimen Description   Final    BLOOD RIGHT ANTECUBITAL Performed at [facility], [address], [place], [place] [address]    Special Requests   Final    BOTTLES DRAWN AEROBIC AND ANAEROBIC Blood Culture adequate volume Performed at [facility], [address] [place], [place] [address]    Culture   Final    NO GROWTH 5 DAYS Performed at [facility], [address], [place], [place] [address]    Report Status 10/15/2023 FINAL  Final    Labs: CBC: Recent Labs  Lab 10/11/23 0714 10/12/23 0755 10/13/23 0444 10/14/23 0537 10/15/23 0518 10/16/23 0509 10/17/23 0458  WBC 13.0* 20.0* 18.9* 17.4* 18.3* 17.6* 16.7*  NEUTROABS 10.4*  15.9* 13.0* 9.4* 10.2*  --   --   HGB 9.5* 10.2* 10.1* 11.6* 9.6* 9.0* 10.0*  HCT 28.2* 29.5* 29.3* 32.0* 28.1* 27.7* 30.9*  MCV 103.3* 100.7* 100.3* 97.6 101.8* 106.1* 108.8*  PLT 134* 164 197 249 379 502* 597*   Basic Metabolic Panel: Recent Labs  Lab 10/11/23 0714 10/11/23 1113 10/12/23 0755 10/13/23 0444 10/14/23 0537 10/15/23 0518 10/16/23 0509 10/17/23 0458  NA 134*   < > 132* 132* 132* 128* 134* 134*  K 2.3*   < > 3.5 3.2* 3.7 2.8* 4.0 3.9  CL 101   < > 99 98 97* 96* 106 104  CO2 23   < > 23 21* 23 24 22  19*  GLUCOSE 80   < > 75 87 80  136* 101* 88  BUN 10   < > <5* <5* <5* <5* <5* <5*  CREATININE 0.66   < > 0.47 0.43* 0.47 0.40* 0.44 0.54  CALCIUM  7.4*   < > 7.8* 7.6* 7.8* 7.5* 8.1* 8.3*  MG 2.3  --  1.9 1.8 1.6* 1.9  --   --   PHOS 2.0*  --  1.7* 1.7* 2.9 2.9  --   --    < > = values in this interval not displayed.   Liver Function Tests: Recent Labs  Lab 10/13/23 0444 10/14/23 0537 10/15/23 0518 10/16/23 0509 10/17/23 0458  AST 141* 96* 80* 72* 70*  ALT 59* 50* 39 40 39  ALKPHOS 219* 219* 177* 151* 133*  BILITOT 1.9* 1.7* 0.8 0.6 0.9  PROT 6.0* 6.4* 5.9* 6.2* 6.4*  ALBUMIN 2.1* 2.1* 2.1* 2.2* 2.4*   CBG: No results for input(s): "GLUCAP" in the last 168 hours.  Discharge time spent: greater than 30 minutes.  Signed: Bertram Brocks, MD [facility] 10/17/2023

## 2023-10-17 NOTE — Progress Notes (Signed)
 Whitney Murphy 10:28 AM  Subjective: Patient doing well no new complaints no fever no abdominal pain eating fine and we again discussed her alcohol just follow-up as well and IR note appreciated  Objective: Vital signs stable afebrile no acute distress abdomen is soft nontender chemistries okay white count slight decrease hemoglobin stable  Assessment: Pancreatitis with developing pseudocyst  Plan: Since she is followed by [facility]  she can follow-up with her primary care doctor next week and then decide whether they want to go to one of their gastroenterologist or come see me and if doing well I would see her back in 2 to 4 weeks to probably repeat a scan at that point and the warnings of when to call or when to come back to the ER were discussed and we also discussed absolutely no alcohol and please let us  know if I can be of any further assistance with this hospital stay  [facility]  office [phone] After 5PM or if no answer call [phone]

## 2023-10-17 NOTE — TOC Transition Note (Signed)
 Transition of Care [facility]) - Discharge Note   Patient Details  Name: ASTI FAVREAU MRN: [id number] Date of Birth: 27-Dec-1970  Transition of Care [facility] CM/SW Contact:  Ruben Corolla, RN Phone Number: 10/17/2023, 12:28 PM   Clinical Narrative:  d/c home no CM needs.     Final next level of care: Home/Self Care Barriers to Discharge: No Barriers Identified   Patient Goals and CMS Choice Patient states their goals for this hospitalization and ongoing recovery are:: Home CMS Medicare.gov Compare Post Acute Care list provided to:: Patient Choice offered to / list presented to : Patient [place] ownership interest in [facility].provided to:: Adult Children    Discharge Placement                       Discharge Plan and Services Additional resources added to the After Visit Summary for     Discharge Planning Services: CM Consult                                 Social Drivers of Health (SDOH) Interventions SDOH Screenings   Food Insecurity: No Food Insecurity (10/10/2023)  Housing: Low Risk  (10/10/2023)  Transportation Needs: No Transportation Needs (10/10/2023)  Utilities: Not At Risk (10/10/2023)  Financial Resource Strain: Low Risk  (08/18/2023)   Received from [facility]  Tobacco Use: High Risk (10/16/2023)     Readmission Risk Interventions     No data to display

## 2024-04-03 ENCOUNTER — Other Ambulatory Visit: Payer: Self-pay

## 2024-04-03 ENCOUNTER — Encounter (HOSPITAL_COMMUNITY): Payer: Self-pay

## 2024-04-03 ENCOUNTER — Emergency Department (HOSPITAL_COMMUNITY)
Admission: EM | Admit: 2024-04-03 | Discharge: 2024-04-03 | Disposition: A | Discharge status: Home / Self Care | Attending: Emergency Medicine | Admitting: Emergency Medicine

## 2024-04-03 DIAGNOSIS — X101XXA Contact with hot food, initial encounter: Secondary | ICD-10-CM | POA: Diagnosis not present

## 2024-04-03 DIAGNOSIS — T25121A Burn of first degree of right foot, initial encounter: Secondary | ICD-10-CM | POA: Diagnosis not present

## 2024-04-03 DIAGNOSIS — S99921A Unspecified injury of right foot, initial encounter: Secondary | ICD-10-CM | POA: Diagnosis present

## 2024-04-03 MED ORDER — OXYCODONE-ACETAMINOPHEN 5-325 MG PO TABS
1.0000 | ORAL_TABLET | Freq: Once | ORAL | Status: AC
  Administered 2024-04-03: 1 via ORAL
  Filled 2024-04-03: qty 1
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
## 2024-04-03 NOTE — ED Notes (Signed)
 Patient d/c with home care instructions.
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
